# Patient Record
Sex: Female | Born: 1970
Health system: Southern US, Community
[De-identification: ages and names within clinical notes are randomized; demographics above are authoritative.]

## PROBLEM LIST (undated history)

## (undated) DIAGNOSIS — F32A Depression, unspecified: Secondary | ICD-10-CM

## (undated) DIAGNOSIS — F329 Major depressive disorder, single episode, unspecified: Secondary | ICD-10-CM

## (undated) DIAGNOSIS — J45909 Unspecified asthma, uncomplicated: Secondary | ICD-10-CM

## (undated) DIAGNOSIS — G43909 Migraine, unspecified, not intractable, without status migrainosus: Secondary | ICD-10-CM

## (undated) DIAGNOSIS — G43019 Migraine without aura, intractable, without status migrainosus: Principal | ICD-10-CM

## (undated) DIAGNOSIS — F419 Anxiety disorder, unspecified: Secondary | ICD-10-CM

## (undated) HISTORY — DX: Anxiety disorder, unspecified: F41.9

## (undated) HISTORY — DX: Unspecified asthma, uncomplicated: J45.909

## (undated) HISTORY — PX: BREAST SURGERY: SHX581

## (undated) HISTORY — DX: Migraine without aura, intractable, without status migrainosus: G43.019

## (undated) HISTORY — PX: ARTHROSCOPIC REPAIR ACL: SUR80

## (undated) HISTORY — DX: Migraine, unspecified, not intractable, without status migrainosus: G43.909

## (undated) HISTORY — PX: KIDNEY SURGERY: SHX687

## (undated) HISTORY — DX: Major depressive disorder, single episode, unspecified: F32.9

## (undated) HISTORY — DX: Depression, unspecified: F32.A

---

## 1991-08-04 HISTORY — PX: GALLBLADDER SURGERY: SHX652

## 2005-01-09 ENCOUNTER — Observation Stay (HOSPITAL_COMMUNITY): Admission: AD | Admit: 2005-01-09 | Discharge: 2005-01-10 | Payer: Self-pay | Admitting: Urology

## 2005-01-09 ENCOUNTER — Emergency Department (HOSPITAL_COMMUNITY): Admission: EM | Admit: 2005-01-09 | Discharge: 2005-01-09 | Payer: Self-pay | Admitting: Emergency Medicine

## 2005-01-20 ENCOUNTER — Encounter: Payer: Self-pay | Admitting: Urology

## 2005-01-20 ENCOUNTER — Inpatient Hospital Stay (HOSPITAL_COMMUNITY): Admission: RE | Admit: 2005-01-20 | Discharge: 2005-01-23 | Payer: Self-pay | Admitting: Urology

## 2005-02-22 ENCOUNTER — Emergency Department (HOSPITAL_COMMUNITY): Admission: EM | Admit: 2005-02-22 | Discharge: 2005-02-23 | Payer: Self-pay | Admitting: Emergency Medicine

## 2005-04-06 ENCOUNTER — Emergency Department (HOSPITAL_COMMUNITY): Admission: EM | Admit: 2005-04-06 | Discharge: 2005-04-07 | Payer: Self-pay | Admitting: Emergency Medicine

## 2006-12-03 ENCOUNTER — Emergency Department (HOSPITAL_COMMUNITY): Admission: EM | Admit: 2006-12-03 | Discharge: 2006-12-03 | Payer: Self-pay | Admitting: Emergency Medicine

## 2007-04-19 IMAGING — US IR DIL URETER W/O STENT*R*
1 series · 2 of 2 positions shown · non-contrast
Comparison: None.

CLINICAL DATA: Partial staghorn calculus on the right with chronic urinary
tract infection. Access requested prior to nephrostolithotomy tomorrow.

PERCUTANEOUS INTRODUCTION OF A RIGHT URETERAL CATHETER 01/20/2005:

[Series 1: sp us guidance · 2 of 2 slices shown]
[im 1/2]
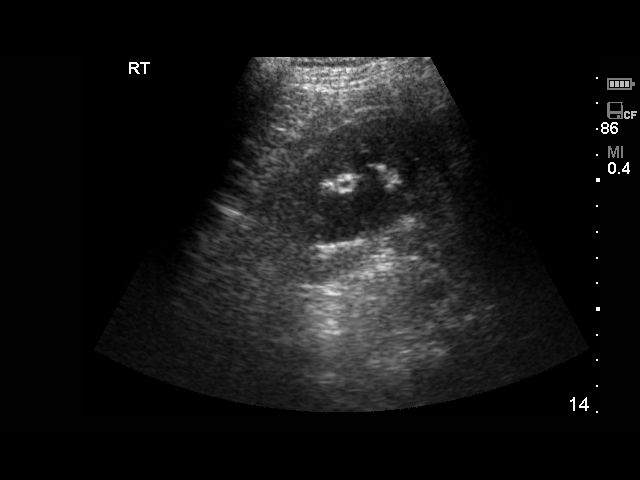
[im 2/2]
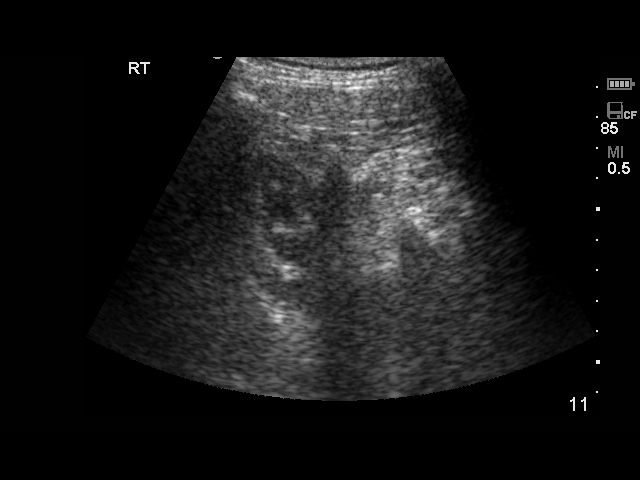

[2 of 2 positions shown; findings below may reference images not displayed]

Medications administered: 400 mg intravenous Ciprofloxacin prior to the
procedure. 5 mg total intravenous     Versed and 300 mcg total intravenous
fentanyl during the procedure.

Total moderate sedation time: 80 minutes.

Total fluoroscopy time: 16.3 minutes.

Total contrast utilized: 5 cc Pmnipaque-AII.

Complications: None.

Procedure:  Informed consent was obtained from the patient prior to procedure.
With the patient prone, using usual sterile technique and 1% lidocaine as local
anesthetic, I initially used a 21-gauge Chiba needle in an attempt to access the
lower pole calyx containing the partial staghorn calculus. I was able to
puncture the stone on 3 separate occasions, and on 2 of these occasions, was
able to advance an 0.018 mandril guidewire into the renal collecting system.
However, I was unable to advance the transitional dilator from the Peider Hangartner into
the collecting system. At one point, I was able to advance a 5 French
dilator into the upper pole, but was unable to direct the guidewire into the
ureter. After these 3 attempts and approximately 45 minutes or so of procedure
time, I elected to abort these attempts.

Therefore, using direct ultrasound visualization, I was able to advance a
21-gauge Chiba needle into the dilated upper pole collecting system. The
mandril wire advanced into the ureter without difficulty. I was then able, with
some difficulty, to advance the transitional dilator from the Peider Hangartner into the
ureter. The mandril wire was then exchanged for a 0.035 Benson wire which was
advanced into the bladder. A 5 French Kumpe catheter was then advanced over the
wire such that its tip is in the bladder. This was confirmed with an injection
of 5 cc of contrast. The catheter was capped and was secured to the patient skin
using a 0 silk suture.

The patient tolerated the procedure well and there were no apparent immediate
complications. She is scheduled for intraoperative nephrostolithotomy tomorrow
with Dr. Trx.
IMPRESSION: Access into the right kidney was obtained via the dilated upper pole collecting
system after 3 unsuccessful attempts at gaining access around the lower pole
calculus. A 5 French Kumpe catheter was placed through the renal collecting
system and ureter such that its tip is in the bladder.

## 2007-05-01 ENCOUNTER — Emergency Department (HOSPITAL_COMMUNITY): Admission: EM | Admit: 2007-05-01 | Discharge: 2007-05-01 | Payer: Self-pay | Admitting: Emergency Medicine

## 2009-02-26 ENCOUNTER — Ambulatory Visit: Payer: Self-pay | Admitting: Urology

## 2009-03-06 ENCOUNTER — Ambulatory Visit: Payer: Self-pay | Admitting: Specialist

## 2009-08-04 ENCOUNTER — Inpatient Hospital Stay: Payer: Self-pay | Admitting: Psychiatry

## 2010-12-19 NOTE — H&P (Signed)
NAME:  Kara Weaver, Kara Weaver NO.:  [id number]   MEDICAL RECORD NO.:  [id number]          PATIENT TYPE:  EMS   LOCATION:  MAJO                         FACILITY:  [facility]   PHYSICIAN:  Lindaann Slough, M.D.  DATE OF BIRTH:  1971-02-12   DATE OF ADMISSION:  01/09/2005  DATE OF DISCHARGE:                                HISTORY & PHYSICAL   CHIEF COMPLAINT:  Right flank pain, fatigue, and hypotension.   HISTORY OF PRESENT ILLNESS:  The patient is a 40 year old female who had  been complaining of right flank pain for the past 2 weeks.  She also had  been having frequency and dysuria.  She went to [facility] 5 days ago  and was told that she had a urinary tract infection and she was sent home  with antibiotics.  She continued to have flank pain and has been sleeping a  lot and had passed out at home.  She was then brought to the emergency room  by her family last night and a CT scan of the abdomen and pelvis showed  right hydronephrosis and a right staghorn calculus.  The patient's blood  pressure on admission was 111/69 and it went down to 87/53.  Urinalysis  shows too numerous to count WBC's, 21 to 50 RBC's, and many bacteria, and is  nitrite positive.  The patient is now admitted for further treatment.   PAST MEDICAL HISTORY:  She had stent placement about 12 years ago on the  left side for ureteral obstruction, but she does not know the exact nature  of the obstruction.  She has a history of asthma.   FAMILY HISTORY:  Her father has a history of kidney disease.  Her mother is  doing fine and she has one sister.   SOCIAL HISTORY:  She is single, has one child 64 years old.  Has smoked 1-  1/2 packs a day for 16 years, does not drink.   MEDICATIONS:  Adderall.   ALLERGIES:  SULFA.   PAST SURGICAL HISTORY:  She had laparoscopic cholecystectomy in 1997.   REVIEW OF SYSTEMS:  No cough, no chest pain, no palpitations.  She complains  of headaches, frequency, and  dysuria.  Everything else is negative.   PHYSICAL EXAMINATION:  GENERAL:  This is a well-developed, 40 year old  female who is complaining of right flank pain.  VITAL SIGNS:  Blood pressure is 91/55, pulse 75, respirations 18,  temperature 97.9.  HEENT:  Head is normal.  Pupils equal, round, and reactive to light and  accommodation.  She has nonicteric sclerae.  She has pink conjunctivae.  Ears, nose, and throat are within normal limits.  NECK:  Supple.  No cervical lymph nodes.  No thyromegaly.  CHEST:  Symmetrical.  Lungs fully expanded and clear to auscultation and  percussion.  HEART:  Regular rate and rhythm, no murmurs and no gallops.  ABDOMEN:  Soft, nondistended.  It is tender in the right flank and right  CVA.  Kidneys are not palpable.  Bladder is not distended.  She has an  umbilical ring.  She has no umbilical or  inguinal hernia.  No inguinal  adenopathy.  Her meatus is normal.  She has no cystocele, no caruncle.  PELVIC:  There is no tenderness in the bladder area.  The cervix is firm in  the midline, nontender.  RECTAL:  She has no external hemorrhoids.  Sphincter tone is normal.  No  rectal mass.  SKIN:  Warm and dry.   I reviewed the CT scan with Dr. Jena Gauss and she has a right staghorn  calculus and right hydronephrosis.  No evidence of a ureteral calculus,  however, the proximal right ureter is moderately dilated.   LABORATORY DATA:  Creatinine is 0.9 and BUN is 12, glucose 83, sodium 139,  potassium 3.5, hemoglobin 12.4, hematocrit 36.2, and WBC 8.3.   ADMISSION DIAGNOSES:  1.  Hypotension.  2.  Right hydronephrosis.  3.  Right staghorn calculus.   PLAN:  The patient needs cystoscopy, retrograde pyelogram, and insertion of  JJ catheter and treatment with IV antibiotics and rehydration.  The  procedure, the risks, and the benefits have been discussed with the patient.  She understands and is agreeable.  We will proceed today.       MN/MEDQ  D:  01/09/2005   T:  01/09/2005  Job:  [id number]

## 2010-12-19 NOTE — Op Note (Signed)
Kara Weaver, Kara Weaver NO.:  [id number]   MEDICAL RECORD NO.:  [id number]          PATIENT TYPE:  OUT   LOCATION:  OMED                         FACILITY:  [facility]   PHYSICIAN:  Lindaann Slough, M.D.  DATE OF BIRTH:  November 15, 1970   DATE OF PROCEDURE:  01/21/2005  DATE OF DISCHARGE:                                 OPERATIVE REPORT   PREOPERATIVE DIAGNOSIS:  Right staghorn calculus.   POSTOPERATIVE DIAGNOSIS:  Right staghorn calculus.   PROCEDURE:  Right percutaneous nephrolithotomy   SURGEONS:  Lindaann Slough, M.D.  Jodi Marble. Fredia Sorrow, M.D.   ANESTHESIA:  General.   INDICATION:  The patient is 40 year old female who was seen in the emergency  room on January 09, 2005 with a 2-week history of right flank pain, frequency  and dysuria.  She was treated 5 days prior her ER visit at [facility]  for urinary tract infection.  A CT scan done in the emergency room showed a  right staghorn calculus and right hydronephrosis. A double-J catheter was  inserted and she was then treated for pyelonephritis.  She returned to the  hospital on January 20, 2005 for right percutaneous  nephrostomy and she is now  scheduled for right PCNL.   Under general anesthesia, the patient was prepped and draped and placed in  the prone position.  The nephrostomy catheter catheter was in the upper pole  calix of the kidney and then the stone is in the middle and lower pole and  at that point, it was decided by Dr. Fredia Sorrow to have a better access to the  calculus by doing a lower pole nephrostomy.  Dr. Fredia Sorrow performed that  procedure and then dilated the tract.  Then the nephroscope was then passed  through the Amplatz sheath and a stone was seen in the renal pelvis.  The  stone was fragmented with the lithoclast and the stone fragments were  removed.  Then the stone fragments in the lower pole of the kidney were  extracted.  Then a nephrostogram was done and showed no  evidence of  remaining stone fragments in the kidney.  The renal pelvis, mid-pole kidney  and lower pole calix were identified and there was no evidence of remaining  fragments in the kidney.  A guidewire was then passed through the Amplatz  sheath and a #28-French Councill catheter was passed over the guidewire into  the renal pelvis.  contrast was then injected through the catheter and there  was no evidence of extravasation of contrast and the tip of the catheter is  in the renal pelvis.  The Amplatz sheath was then removed.  The guidewire  was removed.  The nephrostomy catheter of the upper pole kidney was  also removed.  A ureteral catheter was passed over the safety guidewire into  the distal ureter and then the safety wire was removed.  Estimated blood  loss -- 100 mL.  Blood replacement -- none.   The patient tolerated the procedure well and left the OR in satisfactory  condition to post-anesthesia care unit.       MN/MEDQ  D:  01/21/2005  T:  01/22/2005  Job:  [id number]

## 2010-12-19 NOTE — H&P (Signed)
NAMEDANITZA, SCHOENFELDT NO.:  [id number]   MEDICAL RECORD NO.:  [id number]          PATIENT TYPE:  OUT   LOCATION:  OMED                         [facility]ACILITY:  [facility]   PHYSICIAN:  Lindaann Slough, M.D.  DATE OF BIRTH:  August 13, 1970   DATE OF ADMISSION:  01/20/2005  DATE OF DISCHARGE:                                HISTORY & PHYSICAL   CHIEF COMPLAINT:  Right staghorn calculus.   HISTORY OF PRESENT ILLNESS:  The patient is a 40 year old who was seen in  the emergency room on January 09, 2005 with a two week history of right flank  pain, frequency and dysuria. A CT scan of the abdomen and pelvis showed  right staghorn calculus. She had a double-J catheter inserted. She is now  scheduled for right percutaneous nephrolithotomy. She had a right  percutaneous nephrostomy done this morning. She is complaining of severe  right flank pain and she is now admitted for PCNL in the morning.   PAST MEDICAL HISTORY:  Positive for asthma.   PAST SURGICAL HISTORY:  She had laparoscopic cholecystectomy in 1997 and had  a stent placement on the left side for ureteral obstruction 12 years ago.   FAMILY HISTORY:  Her father has a history of kidney disease and she has one  sister, her mother is doing fine.   SOCIAL HISTORY:  She is single, has one child, has smoked 1 1/2 pack a day  for 16 years, does not drink.   MEDICATIONS:  Adderall.   ALLERGIES:  She is allergic to SULFA.   REVIEW OF SYMPTOMS:  She complains of right flank pain, frequency and  dysuria. Everything else is negative.   PHYSICAL EXAMINATION:  GENERAL:  This is a well-developed 40 year old female  who is complaining of right flank pain.  VITAL SIGNS:  Blood pressure is 111/66, pulse 88, respirations 21,  temperature 97.  HEENT:  Her head is normal, pupils are equal and reactive to light and  accommodation. She has nonicteric sclerae and pink conjunctivae. Ears, nose  and throat are within normal limits.  NECK:   Supple, no cervical adenopathy, no thyromegaly.  LUNGS:  Clear to auscultation and percussion.  HEART:  Regular rhythm.  ABDOMEN:  Soft. She is tender in the right flank and she has right CVA  tenderness. Her kidneys are not palpable, her bladder is not distended. She  has no umbilical or inguinal hernia. Meatus is normal. She has no co  cystocele, no caruncle.  PELVIC/RECTAL:  Not done at this time. She had pelvic and rectal examination  done on January 09, 2005.   IMPRESSION:  Right staghorn calculus.   PLAN:  Percutaneous nephrolithotomy in the morning.       MN/MEDQ  D:  01/20/2005  T:  01/20/2005  Job:  [id number]

## 2010-12-19 NOTE — Discharge Summary (Signed)
Kara Weaver, Kara Weaver NO.:  [id number]   MEDICAL RECORD NO.:  [id number]          PATIENT TYPE:  INP   LOCATION:  1419                         FACILITY:  [facility]   PHYSICIAN:  Lindaann Slough, M.D.  DATE OF BIRTH:  1970-09-30   DATE OF ADMISSION:  01/20/2005  DATE OF DISCHARGE:  01/23/2005                                 DISCHARGE SUMMARY   DISCHARGE DIAGNOSES:  1.  Right staghorn calculus.  2.  Pyelonephritis.  3.  Hydronephrosis.   PROCEDURE:  1.  Right percutaneous nephrostomy on January 20, 2005.  2.  Right percutaneous nephrolithotomy on January 21, 2005.  3.  Right nephrostogram and insertion of double-J catheter, January 23, 2005.   The patient is a 40 year old female who was seen in the emergency room on  January 09, 2005 with a two-week history of right flank pain, frequency, and  dysuria.  CT scan of the abdomen and pelvis showed right staghorn calculus  with right hydronephrosis and perinephric inflammation.  A right double-J  catheter was then inserted, and she was discharged home.  She was readmitted  on January 20, 2005 for right PCNL.  She had a right percutaneous nephrostomy  done on January 20, 2005 and then percutaneous nephrolithotomy on January 21, 2005.   PHYSICAL EXAMINATION:  VITAL SIGNS:  Blood pressure 111/66, pulse 88,  respirations 21, temperature 97.  HEENT:  Head normal.  Pupils are equal and reactive to light and  accommodation.  Ears, nose, and throat  within normal limits.  NECK:  Supple.  No cervical adenopathy.  No thyromegaly.  LUNGS:  Lungs are clear to auscultation and percussion.  HEART:  Regular rhythm.  ABDOMEN:  Soft, nondistended.  Tender in the right flank with right CVA  tenderness.  Bowel sounds were normal.   Hemoglobin 12.4, hematocrit 36.2, WBC 8.3.  Sodium 139, potassium 3.5, BUN  12, creatinine 0.9.   The patient had right percutaneous nephrolithotomy on January 21, 2005.  Hemoglobin postop was 10.3.  Hematocrit 29.9.  WBC  9.3.   She was treated with Ancef and gentamicin.  The nephrostomy and Foley  catheter were draining clear urine.  She remained afebrile.  On January 23, 2005, a nephrostogram showed no evidence of remaining stone fragments.  The  nephrostomy catheter was removed and replaced with an antegrade double-J  catheter.  The Foley catheter was removed.  She was then discharged home on  Cipro 500 mg twice daily, Dilaudid 4 mg every 4-6 hours as needed for pain,  Phenergan 4 mg every 4-6 hours as needed for pain, albuterol inhaler 2 puffs  4 times per day as needed, Advair inhaler 1 puff twice daily.   CONDITION ON DISCHARGE:  Improved.   DISCHARGE DIET:  Regular.   Patient was instructed to not to do any lifting, straining, or driving until  further advised.  She will be seen in the office in two weeks.  If she does  not have anymore drainage from the flank, the double-J catheter  will then  be removed.       MN/MEDQ  D:  01/23/2005  T:  01/24/2005  Job:  [id number]

## 2010-12-19 NOTE — Op Note (Signed)
NAMELANISHA, Kara Weaver NO.:  [id number]   MEDICAL RECORD NO.:  [id number]          PATIENT TYPE:  INP   LOCATION:  0001                         FACILITY:  [facility]   PHYSICIAN:  Lindaann Slough, M.D.  DATE OF BIRTH:  03/07/1971   DATE OF PROCEDURE:  01/09/2005  DATE OF DISCHARGE:                                 OPERATIVE REPORT   PREOPERATIVE DIAGNOSES:  Right staghorn calculus, right hydronephrosis,  urinary tract infection.   POSTOPERATIVE DIAGNOSES:  Right staghorn calculus, right hydronephrosis,  urinary tract infection.   PROCEDURE:  Cystoscopy, right retrograde pyelogram and insertion of double-J  catheter.   SURGEON:  Lindaann Slough, M.D.   ANESTHESIA:  General.   INDICATIONS FOR PROCEDURE:  The patient is a 40 year old female who had been  complaining of right flank pain for the past two weeks. The pain has been  associated with fatigue and nausea. She was seen at [facility] a week  ago and was sent home with antibiotics for urinary tract infection. She did  not improve and went to the emergency room last night and a CT scan of the  abdomen and pelvis showed right staghorn calculus and hydronephrosis of the  upper pole of the right kidney. She also was hypotensive. She is now  admitted for further treatment and scheduled today for cystoscopy and  insertion of double-J catheter.   Under general anesthesia, the patient was prepped and draped and placed in  the dorsal lithotomy position. A #22 Wappler cystoscope was inserted in the  bladder. Cloudy urine was drained out of the bladder. There is no stone or  tumor in the bladder. The ureteral orifices are in normal position and  shape.   A cone tip catheter was then passed through the cystoscope into the right  ureteral orifice, contrast was then injected through the cone tip catheter.  The ureter appeared normal. There is a large filling defect in the renal  pelvis and in the lower pole and mid  pole calices of the right kidney. The  cone tip catheter was then removed.   Cloudy urine drained out of the right kidney. A guidewire was then passed  through the cystoscope into the right ureter and then a #6 French 26 double-  J catheter was passed over the guidewire. The proximal curl of the double-J  catheter is in the dilated upper pole calix. The distal curl is in the  bladder. The bladder was then emptied and the cystoscope and guidewire were  removed.   The patient tolerated the procedure well and left the OR in satisfactory  condition to post anesthesia care unit.      MN/MEDQ  D:  01/09/2005  T:  01/09/2005  Job:  [id number]

## 2011-05-14 LAB — CBC
HCT: 37.7
MCHC: 34.2
MCV: 88.4
Platelets: 277
RDW: 13.1

## 2011-05-14 LAB — URINALYSIS, ROUTINE W REFLEX MICROSCOPIC
Glucose, UA: NEGATIVE
Ketones, ur: NEGATIVE
Nitrite: NEGATIVE
Protein, ur: NEGATIVE

## 2011-05-14 LAB — DIFFERENTIAL
Basophils Absolute: 0.1
Eosinophils Absolute: 0.1
Eosinophils Relative: 1

## 2011-05-14 LAB — URINE CULTURE: Colony Count: >=100000

## 2011-05-14 LAB — BASIC METABOLIC PANEL
CO2: 24
Creatinine, Ser: 0.69

## 2011-05-14 LAB — URINE MICROSCOPIC-ADD ON

## 2011-05-14 LAB — POCT PREGNANCY, URINE: Operator id: 272551

## 2011-05-26 IMAGING — CT CT ABDOMEN WO/W CM
2 of 4 series · 14 of 32 positions shown, 19 images · non-contrast
Comparison: none

REASON FOR EXAM: UTI pyelonephritis
COMMENTS:

[Series 3: with · axial · 0.70mm/px · z∈[-882,-670]mm · 8 of 93 slices shown, 13 images]
[im 11/93  soft-tissue]
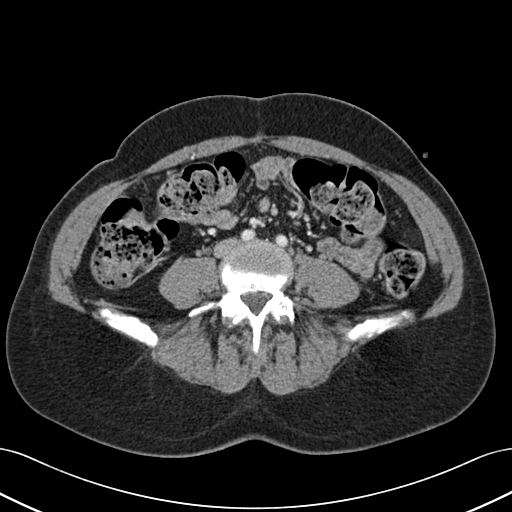
[im 11/93  bone]
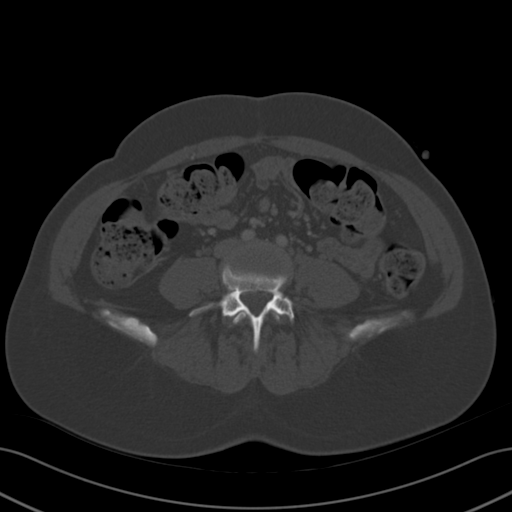
[im 21/93  soft-tissue]
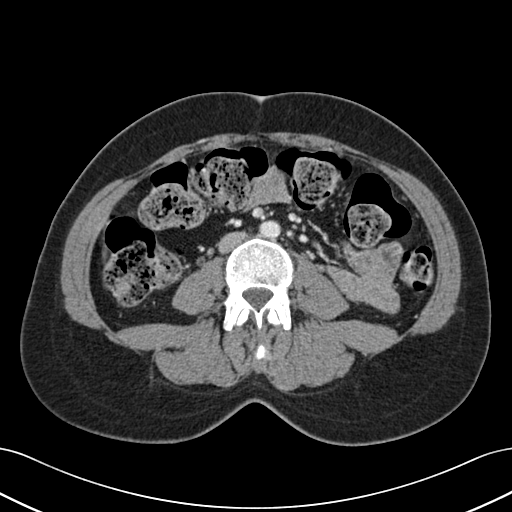
[im 31/93  soft-tissue]
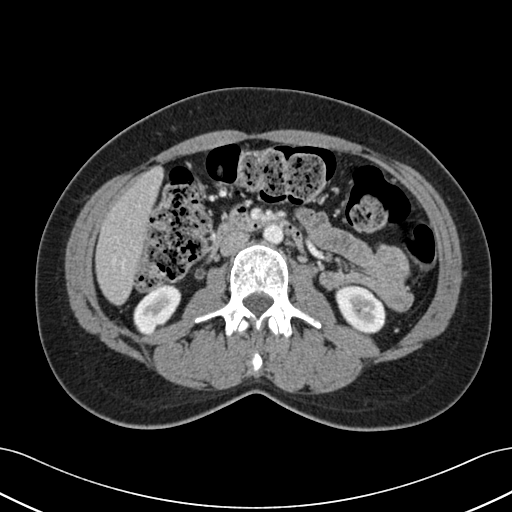
[im 41/93  soft-tissue]
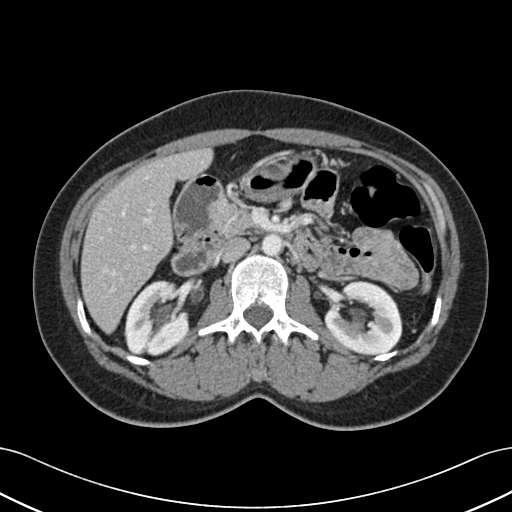
[im 52/93  soft-tissue]
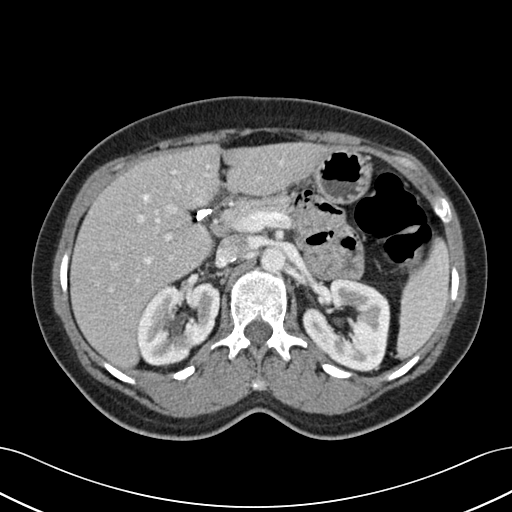
[im 52/93  lung]
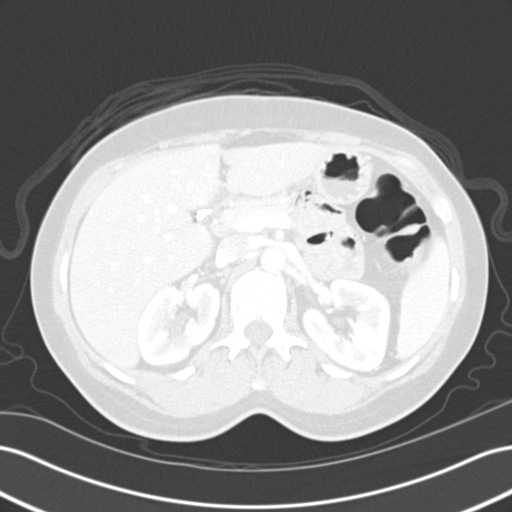
[im 62/93  soft-tissue]
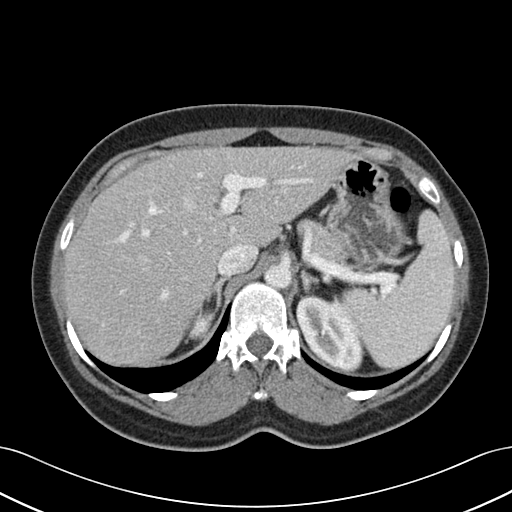
[im 62/93  lung]
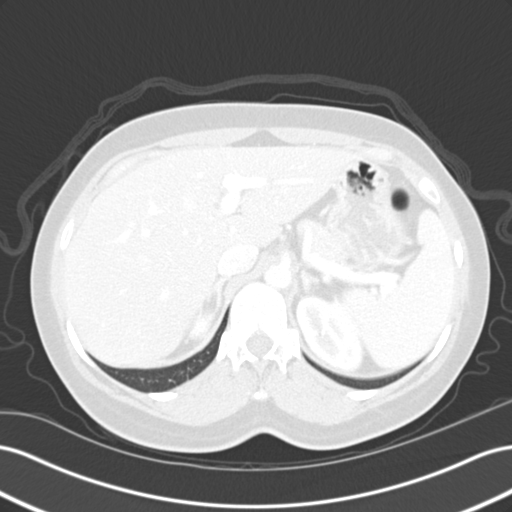
[im 72/93  soft-tissue]
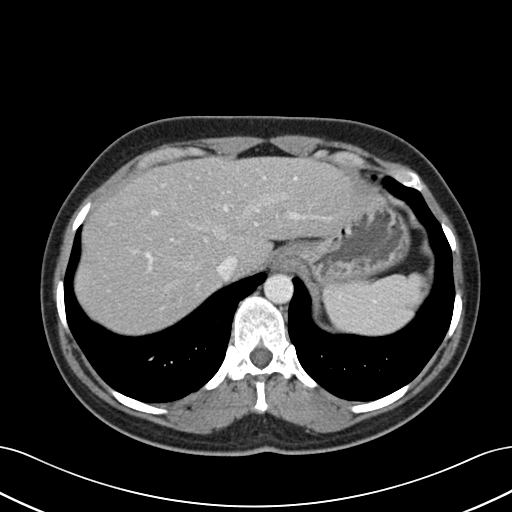
[im 72/93  lung]
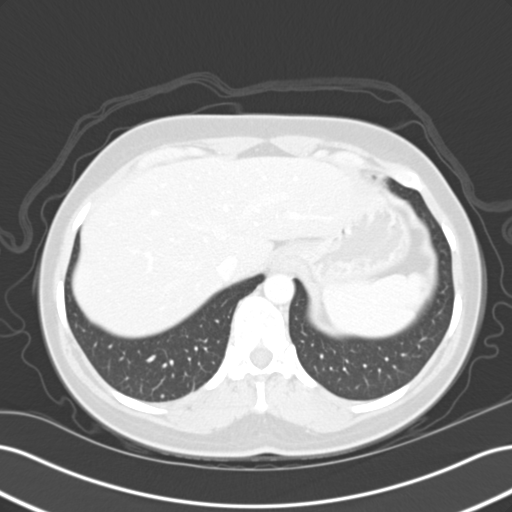
[im 82/93  soft-tissue]
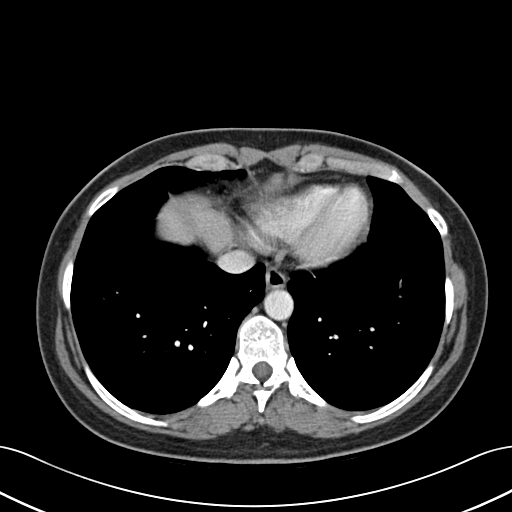
[im 82/93  lung]
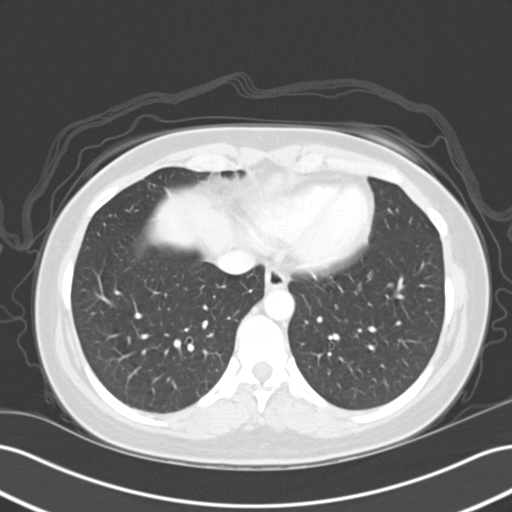

[Series 4: delay · axial · delayed · 0.70mm/px · z∈[-882,-730]mm · 6 of 93 slices shown]
[im 11/93  soft-tissue]
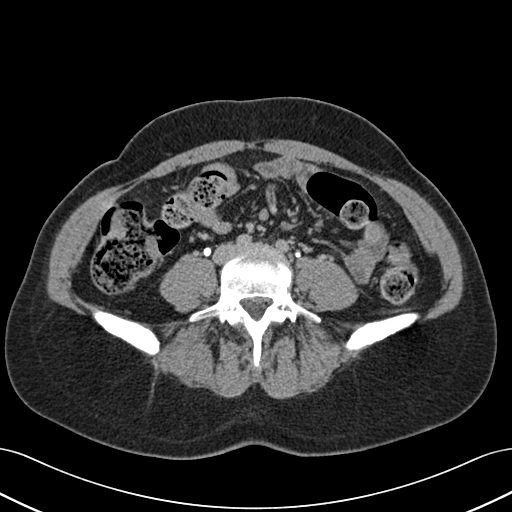
[im 21/93  soft-tissue]
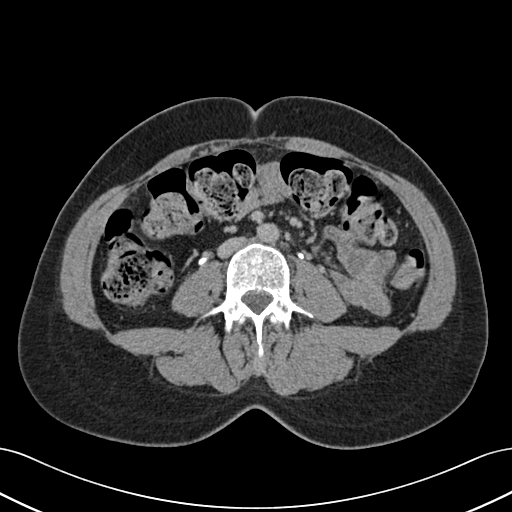
[im 31/93  soft-tissue]
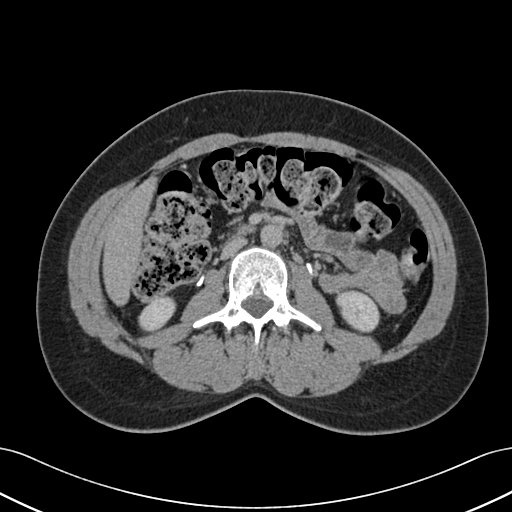
[im 41/93  soft-tissue]
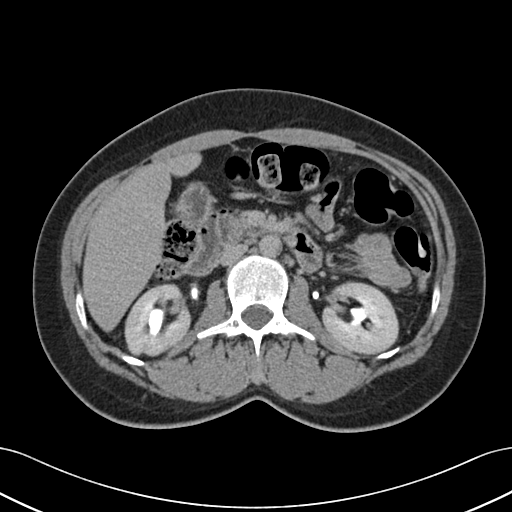
[im 52/93  soft-tissue]
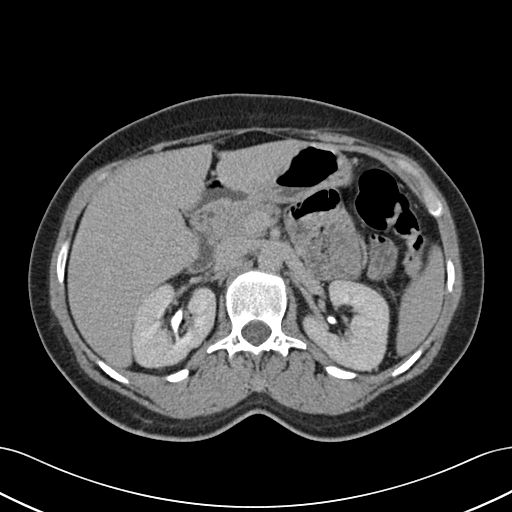
[im 62/93  soft-tissue]
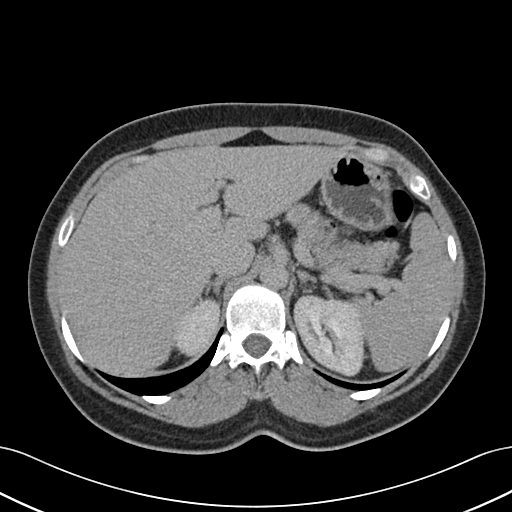

[14 of 32 positions shown; findings below may reference images not displayed]

PROCEDURE:     CT  - CT ABDOMEN STANDARD W/WO  - February 26, 2009  [DATE]

RESULT:     Triphasic CT of the abdomen is performed in the standard
fashion. Images are reconstructed at 3 mm slice thickness. The patient has
no previous examination for comparison.

There is left nephrolithiasis with a stone demonstrated in the midpole of
the left kidney on image #49 measuring approximately 2.5 to 3 mm. There is
no evidence of hydronephrosis or hydroureter. No definite additional calculi
are evident. There is no evidence of abnormal bowel distention.
Cholecystectomy clips are present. There is linear calcification along the
posterior aspect of the inferior vena cava. This could be volume averaging
with some atherosclerotic calcification in a renal artery but that felt to
be unlikely given the lack of atherosclerotic calcification elsewhere. There
does not appear to be a definite filling defect to suggest a soft tissue
mass or thrombus in the inferior vena cava. There does appear to be some
fatty infiltration of the liver. The kidneys demonstrate no discrete mass.
Both kidneys excrete contrast opacified urine on delayed images. There is no
evidence of adenopathy. Pancreas, spleen and adrenal glands appear
unremarkable.
IMPRESSION: 1. Nonobstructing left renal calculus.
2. Calcification along the posterior aspect of the inferior vena cava just
superior to the level of the left renal vein.
3. Cholecystectomy clips.
4. Fatty infiltration of the liver.

## 2016-09-27 DIAGNOSIS — N3000 Acute cystitis without hematuria: Secondary | ICD-10-CM | POA: Diagnosis not present

## 2016-09-27 DIAGNOSIS — N23 Unspecified renal colic: Secondary | ICD-10-CM | POA: Diagnosis not present

## 2016-12-05 DIAGNOSIS — N390 Urinary tract infection, site not specified: Secondary | ICD-10-CM | POA: Diagnosis not present

## 2016-12-05 DIAGNOSIS — E876 Hypokalemia: Secondary | ICD-10-CM | POA: Diagnosis not present

## 2016-12-05 DIAGNOSIS — N23 Unspecified renal colic: Secondary | ICD-10-CM | POA: Diagnosis not present

## 2016-12-05 DIAGNOSIS — R109 Unspecified abdominal pain: Secondary | ICD-10-CM | POA: Diagnosis not present

## 2016-12-30 DIAGNOSIS — N3001 Acute cystitis with hematuria: Secondary | ICD-10-CM | POA: Diagnosis not present

## 2016-12-31 DIAGNOSIS — Z79899 Other long term (current) drug therapy: Secondary | ICD-10-CM | POA: Diagnosis not present

## 2016-12-31 DIAGNOSIS — F319 Bipolar disorder, unspecified: Secondary | ICD-10-CM | POA: Diagnosis not present

## 2017-01-15 DIAGNOSIS — Z681 Body mass index (BMI) 19 or less, adult: Secondary | ICD-10-CM | POA: Diagnosis not present

## 2017-01-15 DIAGNOSIS — G43109 Migraine with aura, not intractable, without status migrainosus: Secondary | ICD-10-CM | POA: Diagnosis not present

## 2017-02-19 DIAGNOSIS — F319 Bipolar disorder, unspecified: Secondary | ICD-10-CM | POA: Diagnosis not present

## 2017-03-08 ENCOUNTER — Ambulatory Visit (INDEPENDENT_AMBULATORY_CARE_PROVIDER_SITE_OTHER): Payer: Medicare Other | Admitting: Neurology

## 2017-03-08 ENCOUNTER — Encounter: Payer: Self-pay | Admitting: Neurology

## 2017-03-08 DIAGNOSIS — G43019 Migraine without aura, intractable, without status migrainosus: Secondary | ICD-10-CM | POA: Insufficient documentation

## 2017-03-08 HISTORY — DX: Migraine without aura, intractable, without status migrainosus: G43.019

## 2017-03-08 MED ORDER — DIVALPROEX SODIUM 500 MG PO DR TAB: 500.0000 mg | DELAYED_RELEASE_TABLET | Freq: Two times a day (BID) | ORAL | 3 refills | Status: DC

## 2017-03-08 MED ORDER — RIZATRIPTAN BENZOATE 10 MG PO TABS: 10.0000 mg | ORAL_TABLET | Freq: Three times a day (TID) | ORAL | 3 refills | Status: DC | PRN

## 2017-03-08 NOTE — Progress Notes (Signed)
Reason for visit: Migraine headache  Referring physician: Dr. Leonia Reader  Kara Weaver is a 46 y.o. female  History of present illness:  Kara Weaver is a 46 year old left-handed white female with a history of chronic migraine headache. The patient has had headaches since her teenage years and throughout her entire adult life. The headaches have gradually become more frequent over time, she is now having over 20 days a month with headache. When she gets a headache sometimes the headache will last a full month before it goes away. The patient indicates that the headaches usually begin around the frontal areas and may go back to the occipital regions. The patient may have some nausea and vomiting, she may have white spots in front of the eyes and she has significant photophobia and phonophobia. Two or 3 days a month she is completely incapacitated, she has to get into a dark room and put a pillow over her head to block the noise. The patient has carpal tunnel syndrome and some tingling in the hands, otherwise she has no numbness. She denies any weakness. She may have some occasional dizziness with the headache and some neck stiffness. She indicates that stress and certain odors may bring on her headache. She is on Topamax for her bipolar disorder and she believes that this has been helpful. She recently was placed on gabapentin but this makes her too spacey and she has difficulty with cognitive processing on the drug. She takes Fioricet at times for the headache, but this is not a daily medication for her. Ibuprofen and Tylenol do not help the headache. She indicates that her mother and her daughter also have headache. She is sent to this office for an evaluation. She has undergone a CT scan of the brain in the past, this has not been done recently. She is very claustrophobic and she is not sure whether she can tolerate a MRI scan or not.  Past Medical History:  Diagnosis Date  . Anxiety   . Asthma     . Depression   . Migraine     Past Surgical History:  Procedure Laterality Date  . ARTHROSCOPIC REPAIR ACL    . BREAST SURGERY Left    Benign mass removed  . GALLBLADDER SURGERY  1993   Removed  . KIDNEY SURGERY     Stent placed    History reviewed. No pertinent family history.  Social history:  reports that she has been smoking.  She has been smoking about 0.50 packs per day. She has never used smokeless tobacco. She reports that she does not drink alcohol or use drugs.  Medications:  Prior to Admission medications   Medication Sig Start Date End Date Taking? Authorizing Provider  ARIPiprazole (ABILIFY) 10 MG tablet Take 10 mg by mouth daily. 10 in the am, and 20mg  in the pm   Yes [provider]  benztropine (COGENTIN) 2 MG tablet Take 2 mg by mouth daily.   Yes [provider]  carbamazepine (TEGRETOL) 200 MG tablet Take 200 mg by mouth 3 (three) times daily. 2 in the morning and 3 in the pm   Yes [provider]  gabapentin (NEURONTIN) 300 MG capsule Take 300 mg by mouth 2 (two) times daily.  02/12/17  Yes [provider]  ondansetron (ZOFRAN) 8 MG tablet TK 1 T PO Q 6 H PRN 01/10/17  Yes [provider]  topiramate (TOPAMAX) 50 MG tablet Take 150 mg by mouth 2 (two)  times daily.   Yes [provider]  traZODone (DESYREL) 100 MG tablet Take 100 mg by mouth at bedtime as needed for sleep.   Yes [provider]  Butalbital-APAP-Caffeine 50-300-40 MG CAPS TK 1 TO 2 CS PO Q 4 H AS NEEDED. NTE 6 CS PER 24 H 01/15/17   [provider]      Allergies  Allergen Reactions  . Bactrim [Sulfamethoxazole-Trimethoprim]   . Doxycycline   . Levaquin [Levofloxacin]   . Morphine And Related   . Phenergan [Promethazine]     ROS:  Out of a complete 14 system review of symptoms, the patient complains only of the following symptoms, and all other reviewed systems are negative.  Weight loss, fatigue Ringing in the  ears Cough Increased thirst Joint pain, joint swelling Allergies, frequent infections Headache, dizziness Depression, anxiety, decreased energy, racing thoughts Insomnia, restless legs  Blood pressure 102/61, pulse 83, height 5\' 8"  (1.727 m), weight 133 lb 8 oz (60.6 kg).  Physical Exam  General: The patient is alert and cooperative at the time of the examination.  Eyes: Pupils are equal, round, and reactive to light. Discs are flat bilaterally.  Neck: The neck is supple, no carotid bruits are noted.  Respiratory: The respiratory examination is clear.  Cardiovascular: The cardiovascular examination reveals a regular rate and rhythm, no obvious murmurs or rubs are noted.  Neuromuscular: Range of movement of the cervical spine was full, no crepitus was noted in the temporomandibular joints.  Skin: Extremities are without significant edema.  Neurologic Exam  Mental status: The patient is alert and oriented x 3 at the time of the examination. The patient has apparent normal recent and remote memory, with an apparently normal attention span and concentration ability.  Cranial nerves: Facial symmetry is present. There is good sensation of the face to pinprick and soft touch bilaterally. The strength of the facial muscles and the muscles to head turning and shoulder shrug are normal bilaterally. Speech is well enunciated, no aphasia or dysarthria is noted. Extraocular movements are full. Visual fields are full. The tongue is midline, and the patient has symmetric elevation of the soft palate. No obvious hearing deficits are noted.  Motor: The motor testing reveals 5 over 5 strength of all 4 extremities. Good symmetric motor tone is noted throughout.  Sensory: Sensory testing is intact to pinprick, soft touch, vibration sensation, and position sense on all 4 extremities. No evidence of extinction is noted.  Coordination: Cerebellar testing reveals good finger-nose-finger and heel-to-shin  bilaterally.  Gait and station: Gait is normal. Tandem gait is normal. Romberg is negative. No drift is seen.  Reflexes: Deep tendon reflexes are symmetric and normal bilaterally. Toes are downgoing bilaterally.   Assessment/Plan:  1. Common migraine, intractable  2. Bipolar disorder  The patient indicates that she does not believe that she can tolerate higher doses of gabapentin. We will get her off the medication a try Depakote taking 500 mg a day for 2 weeks and then go to 500 mg twice daily. The Depakote may also benefit her bipolar disorder. The patient will follow-up in 3 months. She was given a prescription for Maxalt to take if needed for the headache. Imitrex previously did not help. She will call for any dose adjustments of her medication. In the future she may be a candidate for Botox or Aimovig.  Marlan Palau. Keith Rody Keadle MD 03/08/2017 2:13 PM  [facility] [address] [place], [place] [id number]  Phone [phone]  Fax [phone]

## 2017-03-08 NOTE — Patient Instructions (Addendum)
   We will stop the gabapentin and start Depakote taking 500 mg twice a day for 2 weeks, then take one tablet twice a day.  Depakote (valproic acid) is a seizure medication that also has an FDA approval for migraine headache. The most common potential side effects of this medication include weight gain, tremor, or possible stomach upset. This medication can potentially cause liver problems. If confusion is noted on this medication, contact our office immediately.   You may take Maxalt if needed for the headache.

## 2017-03-11 DIAGNOSIS — R51 Headache: Secondary | ICD-10-CM | POA: Diagnosis not present

## 2017-03-23 ENCOUNTER — Telehealth: Payer: Self-pay | Admitting: Neurology

## 2017-03-23 MED ORDER — GABAPENTIN 300 MG PO CAPS: ORAL_CAPSULE | ORAL | 3 refills | Status: DC

## 2017-03-23 NOTE — Addendum Note (Signed)
Addended by: York Spaniel on: 03/23/2017 05:05 PM   Modules accepted: Orders

## 2017-03-23 NOTE — Telephone Encounter (Signed)
error 

## 2017-03-23 NOTE — Telephone Encounter (Signed)
Patient called office in reference to being prescribed divalproex (DEPAKOTE) 500 MG DR tablet she had to stop the medication due to effecting her ARIPiprazole (ABILIFY) 10 MG tablet, carbamazepine (TEGRETOL) 200 MG tablet it was making her moods very bad.   Please call with any questions.

## 2017-03-23 NOTE — Telephone Encounter (Signed)
I called patient. The Depakote was making her moody, she stopped the medication.  We will try to get back on the gabapentin taking 300 mg in the morning and 600 mg in the evening. The patient may benefit from Aimovig in the future.

## 2017-03-27 DIAGNOSIS — H10022 Other mucopurulent conjunctivitis, left eye: Secondary | ICD-10-CM | POA: Diagnosis not present

## 2017-04-02 ENCOUNTER — Telehealth: Payer: Self-pay | Admitting: *Deleted

## 2017-04-02 NOTE — Telephone Encounter (Signed)
Request was received to complete a PA for Rizatriptan 10mg  tabs #12.  Per [facility], no PA needed.  Pharmacy tried to run rx as a 4 day supply.  They need to run rx. as a 30 day supply.  I have called pharmacy and let them know.  I have spoken with pt. and let her know as well/fim

## 2017-04-09 DIAGNOSIS — R202 Paresthesia of skin: Secondary | ICD-10-CM | POA: Diagnosis not present

## 2017-04-09 DIAGNOSIS — R200 Anesthesia of skin: Secondary | ICD-10-CM | POA: Diagnosis not present

## 2017-04-09 DIAGNOSIS — Z23 Encounter for immunization: Secondary | ICD-10-CM | POA: Diagnosis not present

## 2017-04-13 DIAGNOSIS — N201 Calculus of ureter: Secondary | ICD-10-CM | POA: Diagnosis not present

## 2017-04-13 DIAGNOSIS — N3020 Other chronic cystitis without hematuria: Secondary | ICD-10-CM | POA: Diagnosis not present

## 2017-04-13 DIAGNOSIS — N318 Other neuromuscular dysfunction of bladder: Secondary | ICD-10-CM | POA: Diagnosis not present

## 2017-04-23 DIAGNOSIS — N200 Calculus of kidney: Secondary | ICD-10-CM | POA: Diagnosis not present

## 2017-04-28 DIAGNOSIS — H43813 Vitreous degeneration, bilateral: Secondary | ICD-10-CM | POA: Diagnosis not present

## 2017-04-29 DIAGNOSIS — N3289 Other specified disorders of bladder: Secondary | ICD-10-CM | POA: Diagnosis not present

## 2017-04-30 DIAGNOSIS — N3020 Other chronic cystitis without hematuria: Secondary | ICD-10-CM | POA: Diagnosis not present

## 2017-04-30 DIAGNOSIS — N200 Calculus of kidney: Secondary | ICD-10-CM | POA: Diagnosis not present

## 2017-04-30 DIAGNOSIS — N201 Calculus of ureter: Secondary | ICD-10-CM | POA: Diagnosis not present

## 2017-05-06 ENCOUNTER — Other Ambulatory Visit: Payer: Self-pay | Admitting: Neurology

## 2017-05-07 DIAGNOSIS — N3020 Other chronic cystitis without hematuria: Secondary | ICD-10-CM | POA: Diagnosis not present

## 2017-05-07 DIAGNOSIS — N3090 Cystitis, unspecified without hematuria: Secondary | ICD-10-CM | POA: Diagnosis not present

## 2017-05-07 DIAGNOSIS — N200 Calculus of kidney: Secondary | ICD-10-CM | POA: Diagnosis not present

## 2017-05-10 DIAGNOSIS — Z0000 Encounter for general adult medical examination without abnormal findings: Secondary | ICD-10-CM | POA: Diagnosis not present

## 2017-05-10 DIAGNOSIS — J45909 Unspecified asthma, uncomplicated: Secondary | ICD-10-CM | POA: Diagnosis not present

## 2017-05-10 DIAGNOSIS — Z1389 Encounter for screening for other disorder: Secondary | ICD-10-CM | POA: Diagnosis not present

## 2017-05-10 DIAGNOSIS — G8929 Other chronic pain: Secondary | ICD-10-CM | POA: Diagnosis not present

## 2017-05-21 DIAGNOSIS — N3020 Other chronic cystitis without hematuria: Secondary | ICD-10-CM | POA: Diagnosis not present

## 2017-05-21 DIAGNOSIS — N200 Calculus of kidney: Secondary | ICD-10-CM | POA: Diagnosis not present

## 2017-05-26 DIAGNOSIS — M25561 Pain in right knee: Secondary | ICD-10-CM | POA: Diagnosis not present

## 2017-05-26 DIAGNOSIS — M6281 Muscle weakness (generalized): Secondary | ICD-10-CM | POA: Diagnosis not present

## 2017-05-26 DIAGNOSIS — M25562 Pain in left knee: Secondary | ICD-10-CM | POA: Diagnosis not present

## 2017-05-26 DIAGNOSIS — R2681 Unsteadiness on feet: Secondary | ICD-10-CM | POA: Diagnosis not present

## 2017-05-26 DIAGNOSIS — R293 Abnormal posture: Secondary | ICD-10-CM | POA: Diagnosis not present

## 2017-05-26 DIAGNOSIS — R2689 Other abnormalities of gait and mobility: Secondary | ICD-10-CM | POA: Diagnosis not present

## 2017-05-28 DIAGNOSIS — R2681 Unsteadiness on feet: Secondary | ICD-10-CM | POA: Diagnosis not present

## 2017-05-28 DIAGNOSIS — M25562 Pain in left knee: Secondary | ICD-10-CM | POA: Diagnosis not present

## 2017-05-28 DIAGNOSIS — M6281 Muscle weakness (generalized): Secondary | ICD-10-CM | POA: Diagnosis not present

## 2017-05-28 DIAGNOSIS — R2689 Other abnormalities of gait and mobility: Secondary | ICD-10-CM | POA: Diagnosis not present

## 2017-05-28 DIAGNOSIS — M25561 Pain in right knee: Secondary | ICD-10-CM | POA: Diagnosis not present

## 2017-05-28 DIAGNOSIS — R293 Abnormal posture: Secondary | ICD-10-CM | POA: Diagnosis not present

## 2017-06-02 DIAGNOSIS — R293 Abnormal posture: Secondary | ICD-10-CM | POA: Diagnosis not present

## 2017-06-02 DIAGNOSIS — M25561 Pain in right knee: Secondary | ICD-10-CM | POA: Diagnosis not present

## 2017-06-02 DIAGNOSIS — M6281 Muscle weakness (generalized): Secondary | ICD-10-CM | POA: Diagnosis not present

## 2017-06-02 DIAGNOSIS — R2689 Other abnormalities of gait and mobility: Secondary | ICD-10-CM | POA: Diagnosis not present

## 2017-06-02 DIAGNOSIS — R2681 Unsteadiness on feet: Secondary | ICD-10-CM | POA: Diagnosis not present

## 2017-06-02 DIAGNOSIS — M25562 Pain in left knee: Secondary | ICD-10-CM | POA: Diagnosis not present

## 2017-06-03 ENCOUNTER — Other Ambulatory Visit: Payer: Self-pay | Admitting: Neurology

## 2017-06-04 DIAGNOSIS — Z1231 Encounter for screening mammogram for malignant neoplasm of breast: Secondary | ICD-10-CM | POA: Diagnosis not present

## 2017-06-08 ENCOUNTER — Ambulatory Visit: Payer: Medicare Other | Admitting: Adult Health

## 2017-06-10 DIAGNOSIS — R293 Abnormal posture: Secondary | ICD-10-CM | POA: Diagnosis not present

## 2017-06-10 DIAGNOSIS — M6281 Muscle weakness (generalized): Secondary | ICD-10-CM | POA: Diagnosis not present

## 2017-06-10 DIAGNOSIS — M25562 Pain in left knee: Secondary | ICD-10-CM | POA: Diagnosis not present

## 2017-06-10 DIAGNOSIS — R2681 Unsteadiness on feet: Secondary | ICD-10-CM | POA: Diagnosis not present

## 2017-06-10 DIAGNOSIS — R2689 Other abnormalities of gait and mobility: Secondary | ICD-10-CM | POA: Diagnosis not present

## 2017-06-10 DIAGNOSIS — M25561 Pain in right knee: Secondary | ICD-10-CM | POA: Diagnosis not present

## 2017-06-15 DIAGNOSIS — R252 Cramp and spasm: Secondary | ICD-10-CM | POA: Diagnosis not present

## 2017-06-15 DIAGNOSIS — R05 Cough: Secondary | ICD-10-CM | POA: Diagnosis not present

## 2017-06-15 DIAGNOSIS — R6883 Chills (without fever): Secondary | ICD-10-CM | POA: Diagnosis not present

## 2017-06-15 DIAGNOSIS — R531 Weakness: Secondary | ICD-10-CM | POA: Diagnosis not present

## 2017-06-17 DIAGNOSIS — R928 Other abnormal and inconclusive findings on diagnostic imaging of breast: Secondary | ICD-10-CM | POA: Diagnosis not present

## 2017-06-17 DIAGNOSIS — R922 Inconclusive mammogram: Secondary | ICD-10-CM | POA: Diagnosis not present

## 2017-07-01 DIAGNOSIS — N926 Irregular menstruation, unspecified: Secondary | ICD-10-CM | POA: Diagnosis not present

## 2017-07-01 DIAGNOSIS — N951 Menopausal and female climacteric states: Secondary | ICD-10-CM | POA: Diagnosis not present

## 2017-07-02 DIAGNOSIS — N3090 Cystitis, unspecified without hematuria: Secondary | ICD-10-CM | POA: Diagnosis not present

## 2017-07-02 DIAGNOSIS — N201 Calculus of ureter: Secondary | ICD-10-CM | POA: Diagnosis not present

## 2017-07-02 DIAGNOSIS — N3020 Other chronic cystitis without hematuria: Secondary | ICD-10-CM | POA: Diagnosis not present

## 2017-07-05 DIAGNOSIS — N201 Calculus of ureter: Secondary | ICD-10-CM | POA: Diagnosis not present

## 2017-07-05 DIAGNOSIS — N3020 Other chronic cystitis without hematuria: Secondary | ICD-10-CM | POA: Diagnosis not present

## 2017-07-08 DIAGNOSIS — N201 Calculus of ureter: Secondary | ICD-10-CM | POA: Diagnosis not present

## 2017-07-08 DIAGNOSIS — J45909 Unspecified asthma, uncomplicated: Secondary | ICD-10-CM | POA: Diagnosis not present

## 2017-07-08 DIAGNOSIS — R110 Nausea: Secondary | ICD-10-CM | POA: Diagnosis not present

## 2017-07-08 DIAGNOSIS — N1330 Unspecified hydronephrosis: Secondary | ICD-10-CM | POA: Diagnosis not present

## 2017-07-08 DIAGNOSIS — D649 Anemia, unspecified: Secondary | ICD-10-CM | POA: Diagnosis not present

## 2017-07-08 DIAGNOSIS — Z87891 Personal history of nicotine dependence: Secondary | ICD-10-CM | POA: Diagnosis not present

## 2017-07-08 DIAGNOSIS — R51 Headache: Secondary | ICD-10-CM | POA: Diagnosis not present

## 2017-07-08 DIAGNOSIS — R0602 Shortness of breath: Secondary | ICD-10-CM | POA: Diagnosis not present

## 2017-07-08 DIAGNOSIS — Z79899 Other long term (current) drug therapy: Secondary | ICD-10-CM | POA: Diagnosis not present

## 2017-07-13 DIAGNOSIS — N3000 Acute cystitis without hematuria: Secondary | ICD-10-CM | POA: Diagnosis not present

## 2017-07-13 DIAGNOSIS — N201 Calculus of ureter: Secondary | ICD-10-CM | POA: Diagnosis not present

## 2017-07-19 DIAGNOSIS — Z79899 Other long term (current) drug therapy: Secondary | ICD-10-CM | POA: Diagnosis not present

## 2017-07-24 DIAGNOSIS — S00451A Superficial foreign body of right ear, initial encounter: Secondary | ICD-10-CM | POA: Diagnosis not present

## 2017-08-02 DIAGNOSIS — R05 Cough: Secondary | ICD-10-CM | POA: Diagnosis not present

## 2017-08-02 DIAGNOSIS — J00 Acute nasopharyngitis [common cold]: Secondary | ICD-10-CM | POA: Diagnosis not present

## 2017-08-05 ENCOUNTER — Ambulatory Visit: Payer: Medicare Other | Admitting: Adult Health

## 2017-08-05 DIAGNOSIS — N201 Calculus of ureter: Secondary | ICD-10-CM | POA: Diagnosis not present

## 2017-08-05 DIAGNOSIS — N3020 Other chronic cystitis without hematuria: Secondary | ICD-10-CM | POA: Diagnosis not present

## 2017-08-12 DIAGNOSIS — G43909 Migraine, unspecified, not intractable, without status migrainosus: Secondary | ICD-10-CM | POA: Diagnosis not present

## 2017-08-12 DIAGNOSIS — R12 Heartburn: Secondary | ICD-10-CM | POA: Diagnosis not present

## 2017-08-12 DIAGNOSIS — J40 Bronchitis, not specified as acute or chronic: Secondary | ICD-10-CM | POA: Diagnosis not present

## 2017-08-12 DIAGNOSIS — F419 Anxiety disorder, unspecified: Secondary | ICD-10-CM | POA: Diagnosis not present

## 2017-08-15 DIAGNOSIS — J189 Pneumonia, unspecified organism: Secondary | ICD-10-CM | POA: Diagnosis not present

## 2017-08-15 DIAGNOSIS — J209 Acute bronchitis, unspecified: Secondary | ICD-10-CM | POA: Diagnosis not present

## 2017-08-31 DIAGNOSIS — H524 Presbyopia: Secondary | ICD-10-CM | POA: Diagnosis not present

## 2017-08-31 DIAGNOSIS — H5203 Hypermetropia, bilateral: Secondary | ICD-10-CM | POA: Diagnosis not present

## 2017-08-31 DIAGNOSIS — H52209 Unspecified astigmatism, unspecified eye: Secondary | ICD-10-CM | POA: Diagnosis not present

## 2017-09-13 DIAGNOSIS — Z720 Tobacco use: Secondary | ICD-10-CM | POA: Diagnosis not present

## 2017-09-13 DIAGNOSIS — R05 Cough: Secondary | ICD-10-CM | POA: Diagnosis not present

## 2017-09-13 DIAGNOSIS — Z5181 Encounter for therapeutic drug level monitoring: Secondary | ICD-10-CM | POA: Diagnosis not present

## 2017-09-13 DIAGNOSIS — Z79899 Other long term (current) drug therapy: Secondary | ICD-10-CM | POA: Diagnosis not present

## 2017-09-13 DIAGNOSIS — F411 Generalized anxiety disorder: Secondary | ICD-10-CM | POA: Diagnosis not present

## 2017-09-13 DIAGNOSIS — G43909 Migraine, unspecified, not intractable, without status migrainosus: Secondary | ICD-10-CM | POA: Diagnosis not present

## 2017-10-07 DIAGNOSIS — Z79899 Other long term (current) drug therapy: Secondary | ICD-10-CM | POA: Diagnosis not present

## 2017-10-07 DIAGNOSIS — J329 Chronic sinusitis, unspecified: Secondary | ICD-10-CM | POA: Diagnosis not present

## 2017-10-12 DIAGNOSIS — Z79899 Other long term (current) drug therapy: Secondary | ICD-10-CM | POA: Diagnosis not present

## 2017-10-12 DIAGNOSIS — Z5181 Encounter for therapeutic drug level monitoring: Secondary | ICD-10-CM | POA: Diagnosis not present

## 2017-10-12 DIAGNOSIS — F419 Anxiety disorder, unspecified: Secondary | ICD-10-CM | POA: Diagnosis not present

## 2017-10-21 ENCOUNTER — Other Ambulatory Visit: Payer: Self-pay | Admitting: Neurology

## 2017-10-21 NOTE — Telephone Encounter (Signed)
Faxed printed/signed rx rizatriptan to [facility] in [place] at [phone]. Received fax confirmation.

## 2017-10-29 DIAGNOSIS — G43909 Migraine, unspecified, not intractable, without status migrainosus: Secondary | ICD-10-CM | POA: Diagnosis not present

## 2017-11-09 DIAGNOSIS — F319 Bipolar disorder, unspecified: Secondary | ICD-10-CM | POA: Diagnosis not present

## 2017-11-09 DIAGNOSIS — Z5181 Encounter for therapeutic drug level monitoring: Secondary | ICD-10-CM | POA: Diagnosis not present

## 2017-11-09 DIAGNOSIS — F411 Generalized anxiety disorder: Secondary | ICD-10-CM | POA: Diagnosis not present

## 2017-11-09 DIAGNOSIS — Z79899 Other long term (current) drug therapy: Secondary | ICD-10-CM | POA: Diagnosis not present

## 2017-11-09 DIAGNOSIS — R300 Dysuria: Secondary | ICD-10-CM | POA: Diagnosis not present

## 2017-11-09 DIAGNOSIS — N390 Urinary tract infection, site not specified: Secondary | ICD-10-CM | POA: Diagnosis not present

## 2017-11-12 DIAGNOSIS — N390 Urinary tract infection, site not specified: Secondary | ICD-10-CM | POA: Diagnosis not present

## 2017-11-12 DIAGNOSIS — G43411 Hemiplegic migraine, intractable, with status migrainosus: Secondary | ICD-10-CM | POA: Diagnosis not present

## 2017-11-12 DIAGNOSIS — E7800 Pure hypercholesterolemia, unspecified: Secondary | ICD-10-CM | POA: Diagnosis not present

## 2017-11-12 DIAGNOSIS — R4781 Slurred speech: Secondary | ICD-10-CM | POA: Diagnosis not present

## 2017-11-12 DIAGNOSIS — R531 Weakness: Secondary | ICD-10-CM | POA: Diagnosis not present

## 2017-11-12 DIAGNOSIS — G43409 Hemiplegic migraine, not intractable, without status migrainosus: Secondary | ICD-10-CM | POA: Diagnosis not present

## 2017-11-12 DIAGNOSIS — E876 Hypokalemia: Secondary | ICD-10-CM | POA: Diagnosis not present

## 2017-11-12 DIAGNOSIS — G43809 Other migraine, not intractable, without status migrainosus: Secondary | ICD-10-CM | POA: Diagnosis not present

## 2017-11-12 DIAGNOSIS — R918 Other nonspecific abnormal finding of lung field: Secondary | ICD-10-CM | POA: Diagnosis not present

## 2017-11-12 DIAGNOSIS — F319 Bipolar disorder, unspecified: Secondary | ICD-10-CM | POA: Diagnosis not present

## 2017-11-12 DIAGNOSIS — E785 Hyperlipidemia, unspecified: Secondary | ICD-10-CM | POA: Diagnosis not present

## 2017-11-12 DIAGNOSIS — I639 Cerebral infarction, unspecified: Secondary | ICD-10-CM | POA: Diagnosis not present

## 2017-11-12 DIAGNOSIS — G43109 Migraine with aura, not intractable, without status migrainosus: Secondary | ICD-10-CM | POA: Diagnosis not present

## 2017-11-12 DIAGNOSIS — F17210 Nicotine dependence, cigarettes, uncomplicated: Secondary | ICD-10-CM | POA: Diagnosis not present

## 2017-11-12 DIAGNOSIS — E860 Dehydration: Secondary | ICD-10-CM | POA: Diagnosis not present

## 2017-11-12 DIAGNOSIS — I6789 Other cerebrovascular disease: Secondary | ICD-10-CM | POA: Diagnosis not present

## 2017-11-12 DIAGNOSIS — Z720 Tobacco use: Secondary | ICD-10-CM | POA: Diagnosis not present

## 2017-11-12 DIAGNOSIS — Z79899 Other long term (current) drug therapy: Secondary | ICD-10-CM | POA: Diagnosis not present

## 2017-11-19 ENCOUNTER — Other Ambulatory Visit: Payer: Self-pay

## 2017-11-19 NOTE — Patient Outreach (Signed)
[facility]) Care Management  11/19/2017  Kara AlbrightCarren A Weaver [birth year] [id number]   Referral received on 11/18/17: patient discharged from an inpatient admission from [facility] on 11/12/17. RNCM called regarding transition of care.   RNCM received immediate call back from client. Two patient identifiers confirmed. Client reports she was hospitalized with "a hemiplegic migraine". She denies any issues at this time. She reports she has a follow up appointment with her provider on Monday 11/22/17. She states she has transportation to her appointments.  Per patient she has a history of Bipolar, obsessive compulsive disorder and Panic attacks. She reports she is being managed by psychiatrist.  She reports she lives with her parents and help to take care of her parents. She declines transition of care follow up by nursing and denies any social work needs. However she states her insurance provider has denied nausea medication. She reports she is on disability and was prescribed two medications for her migraines and states her nausea medication was denied. She is not able to state the names of the medications she is on.  Plan: referral to pharmacy to follow up.  Kara SheriffJuana Ilhan Debenedetto, RN, MSN, [facility],CCM [facility] Community Care Coordinator Cell: [phone]

## 2017-11-22 DIAGNOSIS — F909 Attention-deficit hyperactivity disorder, unspecified type: Secondary | ICD-10-CM | POA: Diagnosis not present

## 2017-11-22 DIAGNOSIS — N390 Urinary tract infection, site not specified: Secondary | ICD-10-CM | POA: Diagnosis not present

## 2017-11-22 DIAGNOSIS — G43909 Migraine, unspecified, not intractable, without status migrainosus: Secondary | ICD-10-CM | POA: Diagnosis not present

## 2017-11-23 ENCOUNTER — Other Ambulatory Visit: Payer: Self-pay

## 2017-11-23 NOTE — Patient Outreach (Signed)
[facility]) Care Management  11/23/2017  Kara FaviaCarren A Weaver May 21, 1971 [id number]   47 year old [facility] for nausea medication.  [facility] for ondansetron.  PMHx includes, but not limited to migraine.  Unsuccessful outreach attempt #1 to [name].  Left HIPAA compliant voice message asking for return call.   Incoming call received from [name]. HIPAA identifiers verified.    Subjective:  During incoming phone call, patient reports that she had gotten a call from her pharmacy informing her that her prescription for nausea was ready for pick up.  Patient reports that she has been referred to another neurologist for a second opinion for her recurrent migraines.    Medication Assistance: Per referral note, patient's nausea prescription (patient could not remember the name of drug) was denied by insurance and she can't afford to pay out of pocket.  Call placed to her pharmacy, [facility], this morning and the pharmacist said the only nausea medication on file was ondansetron 4 mg tablet.  Pharmacist states that she is able to process it today through insurance and that is ready for pick up.    Patient is pleased her prescription is ready.  Patient verbalized there are no other medication needs at this time.  She states she will call me back in the future if she has a medication she can't afford.  I informed patient that I will close her [facility] case, but will be happy to assist in the future if medication needs arise.  Plan: Route case closure letter to J.  Derrick RavelMawoneke, NP.    Berlin HunJennifer Lilliane Sposito, PharmD Clinical Pharmacist [facility] [phone]

## 2017-11-26 ENCOUNTER — Other Ambulatory Visit: Payer: Self-pay | Admitting: Neurology

## 2017-12-10 DIAGNOSIS — R350 Frequency of micturition: Secondary | ICD-10-CM | POA: Diagnosis not present

## 2017-12-10 DIAGNOSIS — F909 Attention-deficit hyperactivity disorder, unspecified type: Secondary | ICD-10-CM | POA: Diagnosis not present

## 2017-12-10 DIAGNOSIS — F419 Anxiety disorder, unspecified: Secondary | ICD-10-CM | POA: Diagnosis not present

## 2017-12-10 DIAGNOSIS — Z5181 Encounter for therapeutic drug level monitoring: Secondary | ICD-10-CM | POA: Diagnosis not present

## 2017-12-10 DIAGNOSIS — Z79899 Other long term (current) drug therapy: Secondary | ICD-10-CM | POA: Diagnosis not present

## 2017-12-17 ENCOUNTER — Other Ambulatory Visit: Payer: Self-pay | Admitting: Neurology

## 2018-01-06 DIAGNOSIS — F319 Bipolar disorder, unspecified: Secondary | ICD-10-CM | POA: Diagnosis not present

## 2018-01-14 DIAGNOSIS — F4310 Post-traumatic stress disorder, unspecified: Secondary | ICD-10-CM | POA: Diagnosis not present

## 2018-01-21 DIAGNOSIS — F419 Anxiety disorder, unspecified: Secondary | ICD-10-CM | POA: Diagnosis not present

## 2018-01-21 DIAGNOSIS — F909 Attention-deficit hyperactivity disorder, unspecified type: Secondary | ICD-10-CM | POA: Diagnosis not present

## 2018-01-21 DIAGNOSIS — R350 Frequency of micturition: Secondary | ICD-10-CM | POA: Diagnosis not present

## 2018-01-21 DIAGNOSIS — Z79899 Other long term (current) drug therapy: Secondary | ICD-10-CM | POA: Diagnosis not present

## 2018-01-21 DIAGNOSIS — Z5181 Encounter for therapeutic drug level monitoring: Secondary | ICD-10-CM | POA: Diagnosis not present

## 2018-02-04 DIAGNOSIS — F319 Bipolar disorder, unspecified: Secondary | ICD-10-CM | POA: Diagnosis not present

## 2018-02-18 DIAGNOSIS — Z79899 Other long term (current) drug therapy: Secondary | ICD-10-CM | POA: Diagnosis not present

## 2018-02-18 DIAGNOSIS — Z5181 Encounter for therapeutic drug level monitoring: Secondary | ICD-10-CM | POA: Diagnosis not present

## 2018-02-18 DIAGNOSIS — F419 Anxiety disorder, unspecified: Secondary | ICD-10-CM | POA: Diagnosis not present

## 2018-02-18 DIAGNOSIS — F902 Attention-deficit hyperactivity disorder, combined type: Secondary | ICD-10-CM | POA: Diagnosis not present

## 2018-02-18 DIAGNOSIS — G43509 Persistent migraine aura without cerebral infarction, not intractable, without status migrainosus: Secondary | ICD-10-CM | POA: Diagnosis not present

## 2018-03-11 DIAGNOSIS — F319 Bipolar disorder, unspecified: Secondary | ICD-10-CM | POA: Diagnosis not present

## 2018-03-18 ENCOUNTER — Other Ambulatory Visit: Payer: Self-pay | Admitting: *Deleted

## 2018-03-18 DIAGNOSIS — Z79899 Other long term (current) drug therapy: Secondary | ICD-10-CM | POA: Diagnosis not present

## 2018-03-18 DIAGNOSIS — Z5181 Encounter for therapeutic drug level monitoring: Secondary | ICD-10-CM | POA: Diagnosis not present

## 2018-03-18 DIAGNOSIS — G43001 Migraine without aura, not intractable, with status migrainosus: Secondary | ICD-10-CM | POA: Diagnosis not present

## 2018-03-18 DIAGNOSIS — F909 Attention-deficit hyperactivity disorder, unspecified type: Secondary | ICD-10-CM | POA: Diagnosis not present

## 2018-03-18 DIAGNOSIS — F419 Anxiety disorder, unspecified: Secondary | ICD-10-CM | POA: Diagnosis not present

## 2018-03-18 NOTE — Patient Outreach (Signed)
[facility] Care Management  03/18/2018  Kara AlbrightCarren A Weaver 1970-10-22 [id number]   Initial unsuccessful telephone outreach to member to complete [employer] health risk assessment screening. Left message on contact number requesting return call.  Bary RichardJanet S. Asani Deniston RN,CCM,CDE [facility] Care Management Coordinator Office Phone [phone] Office Fax [phone]

## 2018-03-25 ENCOUNTER — Other Ambulatory Visit: Payer: Self-pay | Admitting: *Deleted

## 2018-03-25 NOTE — Patient Outreach (Addendum)
[facility]) Care Management  03/25/2018  Kara Weaver [birth year] [id number]   Second telephone outreach successful in completing [employer] health risk assessment screening. 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 She says she has chronic asthma and bronchitis and had a dyspneic episode that awakened her from sleep last night that she was able to self treat with her rescue inhaler. She denies any other recent exacerbations. She says she is interested in getting a home nebulizer but would like to know the out of pocket cost before she speaks to her provider about it. Advised Kara Weaver this RNCM will contact her [employer] concierge and request that the concierge contact Kara Weaver with the nebulizer information. Kara Weaver declined the offer of a Health Coach for her chronic  lung problems.  Kara Weaver says she was diagnosed with bipolar disorder about 20 years ago. She says she also has ADHD, OCD, and PTSD. She states she is adherent with her medications and says she sees a psychiatrist on a regular basis. She declined the offer of a [facility] Care Management licensed clinical social worker for supplemental counseling.  Kara Weaver is interested in completing an advance directive for herself and her parents so will mail 3 advance directive packets along with a successful outreach letter with a program brochure and 24 Hour Nurse Advice Line magnet.  Kara RichardJanet S. Hauser RN,CCM,CDE [facility] Care Management Coordinator Office Phone [phone] Office Fax [phone]

## 2018-04-05 DIAGNOSIS — J4521 Mild intermittent asthma with (acute) exacerbation: Secondary | ICD-10-CM | POA: Diagnosis not present

## 2018-04-05 DIAGNOSIS — G629 Polyneuropathy, unspecified: Secondary | ICD-10-CM | POA: Diagnosis not present

## 2018-04-05 DIAGNOSIS — R300 Dysuria: Secondary | ICD-10-CM | POA: Diagnosis not present

## 2018-04-05 DIAGNOSIS — R05 Cough: Secondary | ICD-10-CM | POA: Diagnosis not present

## 2018-04-05 DIAGNOSIS — E559 Vitamin D deficiency, unspecified: Secondary | ICD-10-CM | POA: Diagnosis not present

## 2018-04-18 DIAGNOSIS — Z23 Encounter for immunization: Secondary | ICD-10-CM | POA: Diagnosis not present

## 2018-04-18 DIAGNOSIS — F902 Attention-deficit hyperactivity disorder, combined type: Secondary | ICD-10-CM | POA: Diagnosis not present

## 2018-04-18 DIAGNOSIS — N390 Urinary tract infection, site not specified: Secondary | ICD-10-CM | POA: Diagnosis not present

## 2018-04-18 DIAGNOSIS — M545 Low back pain: Secondary | ICD-10-CM | POA: Diagnosis not present

## 2018-04-18 DIAGNOSIS — F419 Anxiety disorder, unspecified: Secondary | ICD-10-CM | POA: Diagnosis not present

## 2018-04-18 DIAGNOSIS — Z79899 Other long term (current) drug therapy: Secondary | ICD-10-CM | POA: Diagnosis not present

## 2018-04-18 DIAGNOSIS — G43909 Migraine, unspecified, not intractable, without status migrainosus: Secondary | ICD-10-CM | POA: Diagnosis not present

## 2018-04-18 DIAGNOSIS — Z5181 Encounter for therapeutic drug level monitoring: Secondary | ICD-10-CM | POA: Diagnosis not present

## 2018-05-12 DIAGNOSIS — J45909 Unspecified asthma, uncomplicated: Secondary | ICD-10-CM | POA: Diagnosis not present

## 2018-05-12 DIAGNOSIS — Z0000 Encounter for general adult medical examination without abnormal findings: Secondary | ICD-10-CM | POA: Diagnosis not present

## 2018-05-12 DIAGNOSIS — E781 Pure hyperglyceridemia: Secondary | ICD-10-CM | POA: Diagnosis not present

## 2018-05-12 DIAGNOSIS — F329 Major depressive disorder, single episode, unspecified: Secondary | ICD-10-CM | POA: Diagnosis not present

## 2018-05-12 DIAGNOSIS — Z1389 Encounter for screening for other disorder: Secondary | ICD-10-CM | POA: Diagnosis not present

## 2018-05-12 DIAGNOSIS — F319 Bipolar disorder, unspecified: Secondary | ICD-10-CM | POA: Diagnosis not present

## 2018-05-12 DIAGNOSIS — G43909 Migraine, unspecified, not intractable, without status migrainosus: Secondary | ICD-10-CM | POA: Diagnosis not present

## 2018-05-12 DIAGNOSIS — M549 Dorsalgia, unspecified: Secondary | ICD-10-CM | POA: Diagnosis not present

## 2018-05-12 DIAGNOSIS — F411 Generalized anxiety disorder: Secondary | ICD-10-CM | POA: Diagnosis not present

## 2018-05-12 DIAGNOSIS — M25569 Pain in unspecified knee: Secondary | ICD-10-CM | POA: Diagnosis not present

## 2018-05-16 DIAGNOSIS — F411 Generalized anxiety disorder: Secondary | ICD-10-CM | POA: Diagnosis not present

## 2018-05-16 DIAGNOSIS — Z5181 Encounter for therapeutic drug level monitoring: Secondary | ICD-10-CM | POA: Diagnosis not present

## 2018-05-16 DIAGNOSIS — F909 Attention-deficit hyperactivity disorder, unspecified type: Secondary | ICD-10-CM | POA: Diagnosis not present

## 2018-05-16 DIAGNOSIS — Z79899 Other long term (current) drug therapy: Secondary | ICD-10-CM | POA: Diagnosis not present

## 2018-06-05 ENCOUNTER — Other Ambulatory Visit: Payer: Self-pay | Admitting: Neurology

## 2018-06-10 DIAGNOSIS — F319 Bipolar disorder, unspecified: Secondary | ICD-10-CM | POA: Diagnosis not present

## 2018-06-17 DIAGNOSIS — F909 Attention-deficit hyperactivity disorder, unspecified type: Secondary | ICD-10-CM | POA: Diagnosis not present

## 2018-06-17 DIAGNOSIS — Z79899 Other long term (current) drug therapy: Secondary | ICD-10-CM | POA: Diagnosis not present

## 2018-06-17 DIAGNOSIS — Z5181 Encounter for therapeutic drug level monitoring: Secondary | ICD-10-CM | POA: Diagnosis not present

## 2018-06-17 DIAGNOSIS — J329 Chronic sinusitis, unspecified: Secondary | ICD-10-CM | POA: Diagnosis not present

## 2018-06-17 DIAGNOSIS — F411 Generalized anxiety disorder: Secondary | ICD-10-CM | POA: Diagnosis not present

## 2018-06-22 DIAGNOSIS — F319 Bipolar disorder, unspecified: Secondary | ICD-10-CM | POA: Diagnosis not present

## 2018-06-22 DIAGNOSIS — G43709 Chronic migraine without aura, not intractable, without status migrainosus: Secondary | ICD-10-CM | POA: Diagnosis not present

## 2018-06-29 DIAGNOSIS — J45909 Unspecified asthma, uncomplicated: Secondary | ICD-10-CM | POA: Diagnosis not present

## 2018-06-29 DIAGNOSIS — R05 Cough: Secondary | ICD-10-CM | POA: Diagnosis not present

## 2018-06-29 DIAGNOSIS — J329 Chronic sinusitis, unspecified: Secondary | ICD-10-CM | POA: Diagnosis not present

## 2018-07-13 DIAGNOSIS — F319 Bipolar disorder, unspecified: Secondary | ICD-10-CM | POA: Diagnosis not present

## 2018-07-15 DIAGNOSIS — M25512 Pain in left shoulder: Secondary | ICD-10-CM | POA: Diagnosis not present

## 2018-07-15 DIAGNOSIS — F411 Generalized anxiety disorder: Secondary | ICD-10-CM | POA: Diagnosis not present

## 2018-07-15 DIAGNOSIS — Z5181 Encounter for therapeutic drug level monitoring: Secondary | ICD-10-CM | POA: Diagnosis not present

## 2018-07-15 DIAGNOSIS — Z1231 Encounter for screening mammogram for malignant neoplasm of breast: Secondary | ICD-10-CM | POA: Diagnosis not present

## 2018-07-15 DIAGNOSIS — F909 Attention-deficit hyperactivity disorder, unspecified type: Secondary | ICD-10-CM | POA: Diagnosis not present

## 2018-07-15 DIAGNOSIS — G8929 Other chronic pain: Secondary | ICD-10-CM | POA: Diagnosis not present

## 2018-07-15 DIAGNOSIS — Z79899 Other long term (current) drug therapy: Secondary | ICD-10-CM | POA: Diagnosis not present

## 2018-08-05 DIAGNOSIS — F411 Generalized anxiety disorder: Secondary | ICD-10-CM | POA: Diagnosis not present

## 2018-08-05 DIAGNOSIS — F410 Panic disorder [episodic paroxysmal anxiety] without agoraphobia: Secondary | ICD-10-CM | POA: Diagnosis not present

## 2018-08-12 DIAGNOSIS — F410 Panic disorder [episodic paroxysmal anxiety] without agoraphobia: Secondary | ICD-10-CM | POA: Diagnosis not present

## 2018-08-12 DIAGNOSIS — G43909 Migraine, unspecified, not intractable, without status migrainosus: Secondary | ICD-10-CM | POA: Diagnosis not present

## 2018-08-12 DIAGNOSIS — R350 Frequency of micturition: Secondary | ICD-10-CM | POA: Diagnosis not present

## 2018-08-12 DIAGNOSIS — Z5181 Encounter for therapeutic drug level monitoring: Secondary | ICD-10-CM | POA: Diagnosis not present

## 2018-08-12 DIAGNOSIS — R109 Unspecified abdominal pain: Secondary | ICD-10-CM | POA: Diagnosis not present

## 2018-08-12 DIAGNOSIS — F411 Generalized anxiety disorder: Secondary | ICD-10-CM | POA: Diagnosis not present

## 2018-08-12 DIAGNOSIS — Z79899 Other long term (current) drug therapy: Secondary | ICD-10-CM | POA: Diagnosis not present

## 2018-08-14 DIAGNOSIS — N200 Calculus of kidney: Secondary | ICD-10-CM | POA: Diagnosis not present

## 2018-08-14 DIAGNOSIS — N390 Urinary tract infection, site not specified: Secondary | ICD-10-CM | POA: Diagnosis not present

## 2018-08-14 DIAGNOSIS — B9689 Other specified bacterial agents as the cause of diseases classified elsewhere: Secondary | ICD-10-CM | POA: Diagnosis not present

## 2018-08-14 DIAGNOSIS — F17210 Nicotine dependence, cigarettes, uncomplicated: Secondary | ICD-10-CM | POA: Diagnosis not present

## 2018-08-14 DIAGNOSIS — E876 Hypokalemia: Secondary | ICD-10-CM | POA: Diagnosis not present

## 2018-09-02 DIAGNOSIS — F411 Generalized anxiety disorder: Secondary | ICD-10-CM | POA: Diagnosis not present

## 2018-09-09 DIAGNOSIS — Z5181 Encounter for therapeutic drug level monitoring: Secondary | ICD-10-CM | POA: Diagnosis not present

## 2018-09-09 DIAGNOSIS — F419 Anxiety disorder, unspecified: Secondary | ICD-10-CM | POA: Diagnosis not present

## 2018-09-09 DIAGNOSIS — G43909 Migraine, unspecified, not intractable, without status migrainosus: Secondary | ICD-10-CM | POA: Diagnosis not present

## 2018-09-09 DIAGNOSIS — F909 Attention-deficit hyperactivity disorder, unspecified type: Secondary | ICD-10-CM | POA: Diagnosis not present

## 2018-09-09 DIAGNOSIS — Z79899 Other long term (current) drug therapy: Secondary | ICD-10-CM | POA: Diagnosis not present

## 2018-09-09 DIAGNOSIS — N200 Calculus of kidney: Secondary | ICD-10-CM | POA: Diagnosis not present

## 2018-09-20 DIAGNOSIS — N390 Urinary tract infection, site not specified: Secondary | ICD-10-CM | POA: Diagnosis not present

## 2018-09-30 DIAGNOSIS — J309 Allergic rhinitis, unspecified: Secondary | ICD-10-CM | POA: Diagnosis not present

## 2018-09-30 DIAGNOSIS — J069 Acute upper respiratory infection, unspecified: Secondary | ICD-10-CM | POA: Diagnosis not present

## 2018-09-30 DIAGNOSIS — B379 Candidiasis, unspecified: Secondary | ICD-10-CM | POA: Diagnosis not present

## 2018-10-07 DIAGNOSIS — N200 Calculus of kidney: Secondary | ICD-10-CM | POA: Diagnosis not present

## 2018-10-07 DIAGNOSIS — Z5181 Encounter for therapeutic drug level monitoring: Secondary | ICD-10-CM | POA: Diagnosis not present

## 2018-10-07 DIAGNOSIS — G43909 Migraine, unspecified, not intractable, without status migrainosus: Secondary | ICD-10-CM | POA: Diagnosis not present

## 2018-10-07 DIAGNOSIS — F909 Attention-deficit hyperactivity disorder, unspecified type: Secondary | ICD-10-CM | POA: Diagnosis not present

## 2018-10-07 DIAGNOSIS — F419 Anxiety disorder, unspecified: Secondary | ICD-10-CM | POA: Diagnosis not present

## 2018-10-07 DIAGNOSIS — Z79899 Other long term (current) drug therapy: Secondary | ICD-10-CM | POA: Diagnosis not present

## 2018-10-19 DIAGNOSIS — R05 Cough: Secondary | ICD-10-CM | POA: Diagnosis not present

## 2018-10-19 DIAGNOSIS — J309 Allergic rhinitis, unspecified: Secondary | ICD-10-CM | POA: Diagnosis not present

## 2018-10-19 DIAGNOSIS — J45909 Unspecified asthma, uncomplicated: Secondary | ICD-10-CM | POA: Diagnosis not present

## 2018-11-04 DIAGNOSIS — D510 Vitamin B12 deficiency anemia due to intrinsic factor deficiency: Secondary | ICD-10-CM | POA: Diagnosis not present

## 2018-11-04 DIAGNOSIS — R109 Unspecified abdominal pain: Secondary | ICD-10-CM | POA: Diagnosis not present

## 2018-11-04 DIAGNOSIS — F419 Anxiety disorder, unspecified: Secondary | ICD-10-CM | POA: Diagnosis not present

## 2018-11-04 DIAGNOSIS — D500 Iron deficiency anemia secondary to blood loss (chronic): Secondary | ICD-10-CM | POA: Diagnosis not present

## 2018-11-04 DIAGNOSIS — Z79899 Other long term (current) drug therapy: Secondary | ICD-10-CM | POA: Diagnosis not present

## 2018-11-04 DIAGNOSIS — Z5181 Encounter for therapeutic drug level monitoring: Secondary | ICD-10-CM | POA: Diagnosis not present

## 2018-11-04 DIAGNOSIS — F909 Attention-deficit hyperactivity disorder, unspecified type: Secondary | ICD-10-CM | POA: Diagnosis not present

## 2018-11-04 DIAGNOSIS — D631 Anemia in chronic kidney disease: Secondary | ICD-10-CM | POA: Diagnosis not present

## 2018-11-04 DIAGNOSIS — G43909 Migraine, unspecified, not intractable, without status migrainosus: Secondary | ICD-10-CM | POA: Diagnosis not present

## 2018-11-04 DIAGNOSIS — I2510 Atherosclerotic heart disease of native coronary artery without angina pectoris: Secondary | ICD-10-CM | POA: Diagnosis not present

## 2018-12-09 DIAGNOSIS — F909 Attention-deficit hyperactivity disorder, unspecified type: Secondary | ICD-10-CM | POA: Diagnosis not present

## 2018-12-30 DIAGNOSIS — F411 Generalized anxiety disorder: Secondary | ICD-10-CM | POA: Diagnosis not present

## 2018-12-30 DIAGNOSIS — F909 Attention-deficit hyperactivity disorder, unspecified type: Secondary | ICD-10-CM | POA: Diagnosis not present

## 2018-12-30 DIAGNOSIS — Z5181 Encounter for therapeutic drug level monitoring: Secondary | ICD-10-CM | POA: Diagnosis not present

## 2018-12-30 DIAGNOSIS — Z79899 Other long term (current) drug therapy: Secondary | ICD-10-CM | POA: Diagnosis not present

## 2019-01-30 DIAGNOSIS — Z5181 Encounter for therapeutic drug level monitoring: Secondary | ICD-10-CM | POA: Diagnosis not present

## 2019-01-30 DIAGNOSIS — Z79899 Other long term (current) drug therapy: Secondary | ICD-10-CM | POA: Diagnosis not present

## 2019-01-30 DIAGNOSIS — F909 Attention-deficit hyperactivity disorder, unspecified type: Secondary | ICD-10-CM | POA: Diagnosis not present

## 2019-01-30 DIAGNOSIS — J45909 Unspecified asthma, uncomplicated: Secondary | ICD-10-CM | POA: Diagnosis not present

## 2019-01-30 DIAGNOSIS — F419 Anxiety disorder, unspecified: Secondary | ICD-10-CM | POA: Diagnosis not present

## 2019-01-30 DIAGNOSIS — M549 Dorsalgia, unspecified: Secondary | ICD-10-CM | POA: Diagnosis not present

## 2019-02-10 DIAGNOSIS — M25511 Pain in right shoulder: Secondary | ICD-10-CM | POA: Diagnosis not present

## 2019-02-28 DIAGNOSIS — N390 Urinary tract infection, site not specified: Secondary | ICD-10-CM | POA: Diagnosis not present

## 2019-02-28 DIAGNOSIS — E782 Mixed hyperlipidemia: Secondary | ICD-10-CM | POA: Diagnosis not present

## 2019-02-28 DIAGNOSIS — Z79899 Other long term (current) drug therapy: Secondary | ICD-10-CM | POA: Diagnosis not present

## 2019-02-28 DIAGNOSIS — F909 Attention-deficit hyperactivity disorder, unspecified type: Secondary | ICD-10-CM | POA: Diagnosis not present

## 2019-02-28 DIAGNOSIS — Z5181 Encounter for therapeutic drug level monitoring: Secondary | ICD-10-CM | POA: Diagnosis not present

## 2019-02-28 DIAGNOSIS — F411 Generalized anxiety disorder: Secondary | ICD-10-CM | POA: Diagnosis not present

## 2019-03-09 DIAGNOSIS — R05 Cough: Secondary | ICD-10-CM | POA: Diagnosis not present

## 2019-03-09 DIAGNOSIS — J45909 Unspecified asthma, uncomplicated: Secondary | ICD-10-CM | POA: Diagnosis not present

## 2019-03-09 DIAGNOSIS — R634 Abnormal weight loss: Secondary | ICD-10-CM | POA: Diagnosis not present

## 2019-03-09 DIAGNOSIS — F419 Anxiety disorder, unspecified: Secondary | ICD-10-CM | POA: Diagnosis not present

## 2019-03-09 DIAGNOSIS — R635 Abnormal weight gain: Secondary | ICD-10-CM | POA: Diagnosis not present

## 2019-03-10 DIAGNOSIS — T50905A Adverse effect of unspecified drugs, medicaments and biological substances, initial encounter: Secondary | ICD-10-CM | POA: Diagnosis not present

## 2019-03-10 DIAGNOSIS — R682 Dry mouth, unspecified: Secondary | ICD-10-CM | POA: Diagnosis not present

## 2019-03-10 DIAGNOSIS — L298 Other pruritus: Secondary | ICD-10-CM | POA: Diagnosis not present

## 2019-03-17 DIAGNOSIS — N200 Calculus of kidney: Secondary | ICD-10-CM | POA: Diagnosis not present

## 2019-03-17 DIAGNOSIS — M940 Chondrocostal junction syndrome [Tietze]: Secondary | ICD-10-CM | POA: Diagnosis not present

## 2019-03-17 DIAGNOSIS — R05 Cough: Secondary | ICD-10-CM | POA: Diagnosis not present

## 2019-03-27 DIAGNOSIS — N390 Urinary tract infection, site not specified: Secondary | ICD-10-CM | POA: Diagnosis not present

## 2019-04-04 DIAGNOSIS — R112 Nausea with vomiting, unspecified: Secondary | ICD-10-CM | POA: Diagnosis not present

## 2019-04-04 DIAGNOSIS — Z5181 Encounter for therapeutic drug level monitoring: Secondary | ICD-10-CM | POA: Diagnosis not present

## 2019-04-04 DIAGNOSIS — F419 Anxiety disorder, unspecified: Secondary | ICD-10-CM | POA: Diagnosis not present

## 2019-04-04 DIAGNOSIS — N390 Urinary tract infection, site not specified: Secondary | ICD-10-CM | POA: Diagnosis not present

## 2019-04-04 DIAGNOSIS — F909 Attention-deficit hyperactivity disorder, unspecified type: Secondary | ICD-10-CM | POA: Diagnosis not present

## 2019-04-04 DIAGNOSIS — Z79899 Other long term (current) drug therapy: Secondary | ICD-10-CM | POA: Diagnosis not present

## 2019-05-02 DIAGNOSIS — G43909 Migraine, unspecified, not intractable, without status migrainosus: Secondary | ICD-10-CM | POA: Diagnosis not present

## 2019-05-02 DIAGNOSIS — Z5181 Encounter for therapeutic drug level monitoring: Secondary | ICD-10-CM | POA: Diagnosis not present

## 2019-05-02 DIAGNOSIS — F909 Attention-deficit hyperactivity disorder, unspecified type: Secondary | ICD-10-CM | POA: Diagnosis not present

## 2019-05-02 DIAGNOSIS — F419 Anxiety disorder, unspecified: Secondary | ICD-10-CM | POA: Diagnosis not present

## 2019-05-02 DIAGNOSIS — Z79899 Other long term (current) drug therapy: Secondary | ICD-10-CM | POA: Diagnosis not present

## 2019-05-15 DIAGNOSIS — R300 Dysuria: Secondary | ICD-10-CM | POA: Diagnosis not present

## 2019-05-15 DIAGNOSIS — G43119 Migraine with aura, intractable, without status migrainosus: Secondary | ICD-10-CM | POA: Diagnosis not present

## 2019-05-15 DIAGNOSIS — J301 Allergic rhinitis due to pollen: Secondary | ICD-10-CM | POA: Diagnosis not present

## 2019-05-15 DIAGNOSIS — F17200 Nicotine dependence, unspecified, uncomplicated: Secondary | ICD-10-CM | POA: Diagnosis not present

## 2019-05-15 DIAGNOSIS — Z720 Tobacco use: Secondary | ICD-10-CM | POA: Diagnosis not present

## 2019-05-30 DIAGNOSIS — J309 Allergic rhinitis, unspecified: Secondary | ICD-10-CM | POA: Diagnosis not present

## 2019-05-30 DIAGNOSIS — R300 Dysuria: Secondary | ICD-10-CM | POA: Diagnosis not present

## 2019-05-30 DIAGNOSIS — Z5181 Encounter for therapeutic drug level monitoring: Secondary | ICD-10-CM | POA: Diagnosis not present

## 2019-05-30 DIAGNOSIS — J329 Chronic sinusitis, unspecified: Secondary | ICD-10-CM | POA: Diagnosis not present

## 2019-05-30 DIAGNOSIS — F419 Anxiety disorder, unspecified: Secondary | ICD-10-CM | POA: Diagnosis not present

## 2019-05-30 DIAGNOSIS — Z79899 Other long term (current) drug therapy: Secondary | ICD-10-CM | POA: Diagnosis not present

## 2019-05-30 DIAGNOSIS — F909 Attention-deficit hyperactivity disorder, unspecified type: Secondary | ICD-10-CM | POA: Diagnosis not present

## 2019-06-07 DIAGNOSIS — R300 Dysuria: Secondary | ICD-10-CM | POA: Diagnosis not present

## 2019-06-12 DIAGNOSIS — J45901 Unspecified asthma with (acute) exacerbation: Secondary | ICD-10-CM | POA: Diagnosis not present

## 2019-06-27 DIAGNOSIS — K1370 Unspecified lesions of oral mucosa: Secondary | ICD-10-CM | POA: Diagnosis not present

## 2019-07-03 DIAGNOSIS — F319 Bipolar disorder, unspecified: Secondary | ICD-10-CM | POA: Diagnosis not present

## 2019-07-03 DIAGNOSIS — G43409 Hemiplegic migraine, not intractable, without status migrainosus: Secondary | ICD-10-CM | POA: Insufficient documentation

## 2019-07-04 DIAGNOSIS — R430 Anosmia: Secondary | ICD-10-CM | POA: Diagnosis not present

## 2019-07-04 DIAGNOSIS — Z5181 Encounter for therapeutic drug level monitoring: Secondary | ICD-10-CM | POA: Diagnosis not present

## 2019-07-04 DIAGNOSIS — F419 Anxiety disorder, unspecified: Secondary | ICD-10-CM | POA: Diagnosis not present

## 2019-07-04 DIAGNOSIS — F909 Attention-deficit hyperactivity disorder, unspecified type: Secondary | ICD-10-CM | POA: Diagnosis not present

## 2019-07-04 DIAGNOSIS — Z79899 Other long term (current) drug therapy: Secondary | ICD-10-CM | POA: Diagnosis not present

## 2019-07-12 DIAGNOSIS — J45901 Unspecified asthma with (acute) exacerbation: Secondary | ICD-10-CM | POA: Diagnosis not present

## 2019-07-19 DIAGNOSIS — S61531A Puncture wound without foreign body of right wrist, initial encounter: Secondary | ICD-10-CM | POA: Diagnosis not present

## 2019-07-19 DIAGNOSIS — S62111B Displaced fracture of triquetrum [cuneiform] bone, right wrist, initial encounter for open fracture: Secondary | ICD-10-CM | POA: Diagnosis not present

## 2019-07-19 DIAGNOSIS — Z23 Encounter for immunization: Secondary | ICD-10-CM | POA: Diagnosis not present

## 2019-07-19 DIAGNOSIS — W540XXA Bitten by dog, initial encounter: Secondary | ICD-10-CM | POA: Diagnosis not present

## 2019-07-19 DIAGNOSIS — S6991XA Unspecified injury of right wrist, hand and finger(s), initial encounter: Secondary | ICD-10-CM | POA: Diagnosis not present

## 2019-07-19 DIAGNOSIS — M7989 Other specified soft tissue disorders: Secondary | ICD-10-CM | POA: Diagnosis not present

## 2019-07-24 DIAGNOSIS — S61551A Open bite of right wrist, initial encounter: Secondary | ICD-10-CM | POA: Diagnosis not present

## 2019-07-25 DIAGNOSIS — S61551A Open bite of right wrist, initial encounter: Secondary | ICD-10-CM | POA: Diagnosis not present

## 2019-07-31 DIAGNOSIS — F319 Bipolar disorder, unspecified: Secondary | ICD-10-CM | POA: Diagnosis not present

## 2019-08-06 DIAGNOSIS — R109 Unspecified abdominal pain: Secondary | ICD-10-CM | POA: Diagnosis not present

## 2019-08-06 DIAGNOSIS — N200 Calculus of kidney: Secondary | ICD-10-CM | POA: Diagnosis not present

## 2019-08-06 DIAGNOSIS — Z5329 Procedure and treatment not carried out because of patient's decision for other reasons: Secondary | ICD-10-CM | POA: Diagnosis not present

## 2019-08-06 DIAGNOSIS — N201 Calculus of ureter: Secondary | ICD-10-CM | POA: Diagnosis not present

## 2019-08-07 DIAGNOSIS — Z79899 Other long term (current) drug therapy: Secondary | ICD-10-CM | POA: Diagnosis not present

## 2019-08-07 DIAGNOSIS — Z5181 Encounter for therapeutic drug level monitoring: Secondary | ICD-10-CM | POA: Diagnosis not present

## 2019-08-07 DIAGNOSIS — R509 Fever, unspecified: Secondary | ICD-10-CM | POA: Diagnosis not present

## 2019-08-07 DIAGNOSIS — Z1159 Encounter for screening for other viral diseases: Secondary | ICD-10-CM | POA: Diagnosis not present

## 2019-08-07 DIAGNOSIS — F909 Attention-deficit hyperactivity disorder, unspecified type: Secondary | ICD-10-CM | POA: Diagnosis not present

## 2019-08-07 DIAGNOSIS — R109 Unspecified abdominal pain: Secondary | ICD-10-CM | POA: Diagnosis not present

## 2019-08-12 DIAGNOSIS — J45901 Unspecified asthma with (acute) exacerbation: Secondary | ICD-10-CM | POA: Diagnosis not present

## 2019-08-14 DIAGNOSIS — N3090 Cystitis, unspecified without hematuria: Secondary | ICD-10-CM | POA: Diagnosis not present

## 2019-08-14 DIAGNOSIS — N200 Calculus of kidney: Secondary | ICD-10-CM | POA: Diagnosis not present

## 2019-08-14 DIAGNOSIS — N201 Calculus of ureter: Secondary | ICD-10-CM | POA: Diagnosis not present

## 2019-08-14 DIAGNOSIS — N3020 Other chronic cystitis without hematuria: Secondary | ICD-10-CM | POA: Diagnosis not present

## 2019-09-08 DIAGNOSIS — F419 Anxiety disorder, unspecified: Secondary | ICD-10-CM | POA: Diagnosis not present

## 2019-09-08 DIAGNOSIS — N200 Calculus of kidney: Secondary | ICD-10-CM | POA: Diagnosis not present

## 2019-09-08 DIAGNOSIS — Z79899 Other long term (current) drug therapy: Secondary | ICD-10-CM | POA: Diagnosis not present

## 2019-09-08 DIAGNOSIS — N390 Urinary tract infection, site not specified: Secondary | ICD-10-CM | POA: Diagnosis not present

## 2019-09-08 DIAGNOSIS — F909 Attention-deficit hyperactivity disorder, unspecified type: Secondary | ICD-10-CM | POA: Diagnosis not present

## 2019-09-08 DIAGNOSIS — Z5181 Encounter for therapeutic drug level monitoring: Secondary | ICD-10-CM | POA: Diagnosis not present

## 2019-09-12 DIAGNOSIS — J45901 Unspecified asthma with (acute) exacerbation: Secondary | ICD-10-CM | POA: Diagnosis not present

## 2019-09-13 DIAGNOSIS — F319 Bipolar disorder, unspecified: Secondary | ICD-10-CM | POA: Diagnosis not present

## 2019-10-04 DIAGNOSIS — Z1231 Encounter for screening mammogram for malignant neoplasm of breast: Secondary | ICD-10-CM | POA: Diagnosis not present

## 2019-10-09 DIAGNOSIS — Z79899 Other long term (current) drug therapy: Secondary | ICD-10-CM | POA: Diagnosis not present

## 2019-10-09 DIAGNOSIS — F909 Attention-deficit hyperactivity disorder, unspecified type: Secondary | ICD-10-CM | POA: Diagnosis not present

## 2019-10-09 DIAGNOSIS — F419 Anxiety disorder, unspecified: Secondary | ICD-10-CM | POA: Diagnosis not present

## 2019-10-09 DIAGNOSIS — Z5181 Encounter for therapeutic drug level monitoring: Secondary | ICD-10-CM | POA: Diagnosis not present

## 2019-10-09 DIAGNOSIS — J309 Allergic rhinitis, unspecified: Secondary | ICD-10-CM | POA: Diagnosis not present

## 2019-10-09 DIAGNOSIS — J329 Chronic sinusitis, unspecified: Secondary | ICD-10-CM | POA: Diagnosis not present

## 2019-10-10 DIAGNOSIS — J45901 Unspecified asthma with (acute) exacerbation: Secondary | ICD-10-CM | POA: Diagnosis not present

## 2019-11-07 DIAGNOSIS — F419 Anxiety disorder, unspecified: Secondary | ICD-10-CM | POA: Diagnosis not present

## 2019-11-07 DIAGNOSIS — F909 Attention-deficit hyperactivity disorder, unspecified type: Secondary | ICD-10-CM | POA: Diagnosis not present

## 2019-11-07 DIAGNOSIS — Z5181 Encounter for therapeutic drug level monitoring: Secondary | ICD-10-CM | POA: Diagnosis not present

## 2019-11-07 DIAGNOSIS — Z79899 Other long term (current) drug therapy: Secondary | ICD-10-CM | POA: Diagnosis not present

## 2019-11-07 DIAGNOSIS — N390 Urinary tract infection, site not specified: Secondary | ICD-10-CM | POA: Diagnosis not present

## 2019-11-07 DIAGNOSIS — M25511 Pain in right shoulder: Secondary | ICD-10-CM | POA: Diagnosis not present

## 2019-11-08 DIAGNOSIS — F319 Bipolar disorder, unspecified: Secondary | ICD-10-CM | POA: Diagnosis not present

## 2019-11-20 DIAGNOSIS — R05 Cough: Secondary | ICD-10-CM | POA: Diagnosis not present

## 2019-11-20 DIAGNOSIS — R430 Anosmia: Secondary | ICD-10-CM | POA: Diagnosis not present

## 2019-11-20 DIAGNOSIS — Z1152 Encounter for screening for COVID-19: Secondary | ICD-10-CM | POA: Diagnosis not present

## 2019-12-08 DIAGNOSIS — Z5181 Encounter for therapeutic drug level monitoring: Secondary | ICD-10-CM | POA: Diagnosis not present

## 2019-12-08 DIAGNOSIS — Z1389 Encounter for screening for other disorder: Secondary | ICD-10-CM | POA: Diagnosis not present

## 2019-12-08 DIAGNOSIS — Z79899 Other long term (current) drug therapy: Secondary | ICD-10-CM | POA: Diagnosis not present

## 2019-12-08 DIAGNOSIS — J45909 Unspecified asthma, uncomplicated: Secondary | ICD-10-CM | POA: Diagnosis not present

## 2019-12-08 DIAGNOSIS — G43909 Migraine, unspecified, not intractable, without status migrainosus: Secondary | ICD-10-CM | POA: Diagnosis not present

## 2019-12-08 DIAGNOSIS — Z0000 Encounter for general adult medical examination without abnormal findings: Secondary | ICD-10-CM | POA: Diagnosis not present

## 2019-12-08 DIAGNOSIS — F3162 Bipolar disorder, current episode mixed, moderate: Secondary | ICD-10-CM | POA: Diagnosis not present

## 2019-12-08 DIAGNOSIS — F419 Anxiety disorder, unspecified: Secondary | ICD-10-CM | POA: Diagnosis not present

## 2019-12-08 DIAGNOSIS — E785 Hyperlipidemia, unspecified: Secondary | ICD-10-CM | POA: Diagnosis not present

## 2019-12-08 DIAGNOSIS — R300 Dysuria: Secondary | ICD-10-CM | POA: Diagnosis not present

## 2019-12-28 DIAGNOSIS — H9209 Otalgia, unspecified ear: Secondary | ICD-10-CM | POA: Diagnosis not present

## 2020-01-08 DIAGNOSIS — R319 Hematuria, unspecified: Secondary | ICD-10-CM | POA: Diagnosis not present

## 2020-01-08 DIAGNOSIS — Z79899 Other long term (current) drug therapy: Secondary | ICD-10-CM | POA: Diagnosis not present

## 2020-01-08 DIAGNOSIS — F419 Anxiety disorder, unspecified: Secondary | ICD-10-CM | POA: Diagnosis not present

## 2020-01-08 DIAGNOSIS — Z5181 Encounter for therapeutic drug level monitoring: Secondary | ICD-10-CM | POA: Diagnosis not present

## 2020-01-08 DIAGNOSIS — F909 Attention-deficit hyperactivity disorder, unspecified type: Secondary | ICD-10-CM | POA: Diagnosis not present

## 2020-01-08 DIAGNOSIS — R05 Cough: Secondary | ICD-10-CM | POA: Diagnosis not present

## 2020-01-10 DIAGNOSIS — J45901 Unspecified asthma with (acute) exacerbation: Secondary | ICD-10-CM | POA: Diagnosis not present

## 2020-01-29 DIAGNOSIS — R05 Cough: Secondary | ICD-10-CM | POA: Diagnosis not present

## 2020-01-29 DIAGNOSIS — J4541 Moderate persistent asthma with (acute) exacerbation: Secondary | ICD-10-CM | POA: Diagnosis not present

## 2020-02-06 DIAGNOSIS — F3163 Bipolar disorder, current episode mixed, severe, without psychotic features: Secondary | ICD-10-CM | POA: Diagnosis not present

## 2020-02-06 DIAGNOSIS — F419 Anxiety disorder, unspecified: Secondary | ICD-10-CM | POA: Diagnosis not present

## 2020-02-06 DIAGNOSIS — F909 Attention-deficit hyperactivity disorder, unspecified type: Secondary | ICD-10-CM | POA: Diagnosis not present

## 2020-02-09 DIAGNOSIS — J45901 Unspecified asthma with (acute) exacerbation: Secondary | ICD-10-CM | POA: Diagnosis not present

## 2020-02-12 DIAGNOSIS — F319 Bipolar disorder, unspecified: Secondary | ICD-10-CM | POA: Diagnosis not present

## 2020-03-05 DIAGNOSIS — F909 Attention-deficit hyperactivity disorder, unspecified type: Secondary | ICD-10-CM | POA: Diagnosis not present

## 2020-03-05 DIAGNOSIS — Z5181 Encounter for therapeutic drug level monitoring: Secondary | ICD-10-CM | POA: Diagnosis not present

## 2020-03-05 DIAGNOSIS — Z79899 Other long term (current) drug therapy: Secondary | ICD-10-CM | POA: Diagnosis not present

## 2020-03-05 DIAGNOSIS — F419 Anxiety disorder, unspecified: Secondary | ICD-10-CM | POA: Diagnosis not present

## 2020-03-11 DIAGNOSIS — J45901 Unspecified asthma with (acute) exacerbation: Secondary | ICD-10-CM | POA: Diagnosis not present

## 2020-04-02 DIAGNOSIS — R000 Tachycardia, unspecified: Secondary | ICD-10-CM | POA: Diagnosis not present

## 2020-04-02 DIAGNOSIS — R300 Dysuria: Secondary | ICD-10-CM | POA: Diagnosis not present

## 2020-04-02 DIAGNOSIS — Z5181 Encounter for therapeutic drug level monitoring: Secondary | ICD-10-CM | POA: Diagnosis not present

## 2020-04-02 DIAGNOSIS — Z79899 Other long term (current) drug therapy: Secondary | ICD-10-CM | POA: Diagnosis not present

## 2020-04-02 DIAGNOSIS — F419 Anxiety disorder, unspecified: Secondary | ICD-10-CM | POA: Diagnosis not present

## 2020-04-02 DIAGNOSIS — F909 Attention-deficit hyperactivity disorder, unspecified type: Secondary | ICD-10-CM | POA: Diagnosis not present

## 2020-04-11 DIAGNOSIS — J45901 Unspecified asthma with (acute) exacerbation: Secondary | ICD-10-CM | POA: Diagnosis not present

## 2020-04-15 DIAGNOSIS — M7501 Adhesive capsulitis of right shoulder: Secondary | ICD-10-CM | POA: Diagnosis not present

## 2020-05-03 DIAGNOSIS — Z5181 Encounter for therapeutic drug level monitoring: Secondary | ICD-10-CM | POA: Diagnosis not present

## 2020-05-03 DIAGNOSIS — Z79899 Other long term (current) drug therapy: Secondary | ICD-10-CM | POA: Diagnosis not present

## 2020-05-03 DIAGNOSIS — N200 Calculus of kidney: Secondary | ICD-10-CM | POA: Diagnosis not present

## 2020-05-03 DIAGNOSIS — G8929 Other chronic pain: Secondary | ICD-10-CM | POA: Diagnosis not present

## 2020-05-03 DIAGNOSIS — T148XXA Other injury of unspecified body region, initial encounter: Secondary | ICD-10-CM | POA: Diagnosis not present

## 2020-05-03 DIAGNOSIS — J302 Other seasonal allergic rhinitis: Secondary | ICD-10-CM | POA: Diagnosis not present

## 2020-05-03 DIAGNOSIS — M25511 Pain in right shoulder: Secondary | ICD-10-CM | POA: Diagnosis not present

## 2020-05-03 DIAGNOSIS — F909 Attention-deficit hyperactivity disorder, unspecified type: Secondary | ICD-10-CM | POA: Diagnosis not present

## 2020-05-03 DIAGNOSIS — R319 Hematuria, unspecified: Secondary | ICD-10-CM | POA: Diagnosis not present

## 2020-05-11 DIAGNOSIS — J45901 Unspecified asthma with (acute) exacerbation: Secondary | ICD-10-CM | POA: Diagnosis not present

## 2020-05-12 DIAGNOSIS — Z20822 Contact with and (suspected) exposure to covid-19: Secondary | ICD-10-CM | POA: Diagnosis not present

## 2020-05-13 DIAGNOSIS — F319 Bipolar disorder, unspecified: Secondary | ICD-10-CM | POA: Diagnosis not present

## 2020-06-03 DIAGNOSIS — Z5181 Encounter for therapeutic drug level monitoring: Secondary | ICD-10-CM | POA: Diagnosis not present

## 2020-06-03 DIAGNOSIS — F419 Anxiety disorder, unspecified: Secondary | ICD-10-CM | POA: Diagnosis not present

## 2020-06-03 DIAGNOSIS — R3911 Hesitancy of micturition: Secondary | ICD-10-CM | POA: Diagnosis not present

## 2020-06-03 DIAGNOSIS — Z79899 Other long term (current) drug therapy: Secondary | ICD-10-CM | POA: Diagnosis not present

## 2020-06-03 DIAGNOSIS — F909 Attention-deficit hyperactivity disorder, unspecified type: Secondary | ICD-10-CM | POA: Diagnosis not present

## 2020-06-07 DIAGNOSIS — R1310 Dysphagia, unspecified: Secondary | ICD-10-CM | POA: Diagnosis not present

## 2020-06-07 DIAGNOSIS — R3911 Hesitancy of micturition: Secondary | ICD-10-CM | POA: Diagnosis not present

## 2020-06-11 DIAGNOSIS — J45901 Unspecified asthma with (acute) exacerbation: Secondary | ICD-10-CM | POA: Diagnosis not present

## 2020-07-02 DIAGNOSIS — F410 Panic disorder [episodic paroxysmal anxiety] without agoraphobia: Secondary | ICD-10-CM | POA: Diagnosis not present

## 2020-07-02 DIAGNOSIS — N390 Urinary tract infection, site not specified: Secondary | ICD-10-CM | POA: Diagnosis not present

## 2020-07-02 DIAGNOSIS — F909 Attention-deficit hyperactivity disorder, unspecified type: Secondary | ICD-10-CM | POA: Diagnosis not present

## 2020-07-11 DIAGNOSIS — J45901 Unspecified asthma with (acute) exacerbation: Secondary | ICD-10-CM | POA: Diagnosis not present

## 2020-07-12 DIAGNOSIS — F332 Major depressive disorder, recurrent severe without psychotic features: Secondary | ICD-10-CM | POA: Diagnosis not present

## 2020-07-12 DIAGNOSIS — R519 Headache, unspecified: Secondary | ICD-10-CM | POA: Diagnosis not present

## 2020-07-12 DIAGNOSIS — F32A Depression, unspecified: Secondary | ICD-10-CM | POA: Diagnosis not present

## 2020-07-12 DIAGNOSIS — F4321 Adjustment disorder with depressed mood: Secondary | ICD-10-CM | POA: Diagnosis not present

## 2020-07-22 DIAGNOSIS — F332 Major depressive disorder, recurrent severe without psychotic features: Secondary | ICD-10-CM | POA: Diagnosis not present

## 2020-07-22 DIAGNOSIS — G43119 Migraine with aura, intractable, without status migrainosus: Secondary | ICD-10-CM | POA: Diagnosis not present

## 2020-07-22 DIAGNOSIS — F4321 Adjustment disorder with depressed mood: Secondary | ICD-10-CM | POA: Diagnosis not present

## 2020-07-30 DIAGNOSIS — G43909 Migraine, unspecified, not intractable, without status migrainosus: Secondary | ICD-10-CM | POA: Diagnosis not present

## 2020-07-30 DIAGNOSIS — G4700 Insomnia, unspecified: Secondary | ICD-10-CM | POA: Diagnosis not present

## 2020-07-30 DIAGNOSIS — Z79899 Other long term (current) drug therapy: Secondary | ICD-10-CM | POA: Diagnosis not present

## 2020-07-30 DIAGNOSIS — Z5181 Encounter for therapeutic drug level monitoring: Secondary | ICD-10-CM | POA: Diagnosis not present

## 2020-07-30 DIAGNOSIS — M62830 Muscle spasm of back: Secondary | ICD-10-CM | POA: Diagnosis not present

## 2020-07-30 DIAGNOSIS — F909 Attention-deficit hyperactivity disorder, unspecified type: Secondary | ICD-10-CM | POA: Diagnosis not present

## 2020-08-09 DIAGNOSIS — R300 Dysuria: Secondary | ICD-10-CM | POA: Diagnosis not present

## 2020-08-09 DIAGNOSIS — R059 Cough, unspecified: Secondary | ICD-10-CM | POA: Diagnosis not present

## 2020-08-09 DIAGNOSIS — M542 Cervicalgia: Secondary | ICD-10-CM | POA: Diagnosis not present

## 2020-08-09 DIAGNOSIS — M546 Pain in thoracic spine: Secondary | ICD-10-CM | POA: Diagnosis not present

## 2020-08-09 DIAGNOSIS — M4802 Spinal stenosis, cervical region: Secondary | ICD-10-CM | POA: Diagnosis not present

## 2020-08-09 DIAGNOSIS — R222 Localized swelling, mass and lump, trunk: Secondary | ICD-10-CM | POA: Diagnosis not present

## 2020-08-09 DIAGNOSIS — M50322 Other cervical disc degeneration at C5-C6 level: Secondary | ICD-10-CM | POA: Diagnosis not present

## 2020-08-09 DIAGNOSIS — M4602 Spinal enthesopathy, cervical region: Secondary | ICD-10-CM | POA: Diagnosis not present

## 2020-08-09 DIAGNOSIS — M545 Low back pain, unspecified: Secondary | ICD-10-CM | POA: Diagnosis not present

## 2020-08-11 DIAGNOSIS — J45901 Unspecified asthma with (acute) exacerbation: Secondary | ICD-10-CM | POA: Diagnosis not present

## 2020-08-12 DIAGNOSIS — F319 Bipolar disorder, unspecified: Secondary | ICD-10-CM | POA: Diagnosis not present

## 2020-08-27 DIAGNOSIS — R300 Dysuria: Secondary | ICD-10-CM | POA: Diagnosis not present

## 2020-08-27 DIAGNOSIS — Z5181 Encounter for therapeutic drug level monitoring: Secondary | ICD-10-CM | POA: Diagnosis not present

## 2020-08-27 DIAGNOSIS — Z79899 Other long term (current) drug therapy: Secondary | ICD-10-CM | POA: Diagnosis not present

## 2020-08-27 DIAGNOSIS — F909 Attention-deficit hyperactivity disorder, unspecified type: Secondary | ICD-10-CM | POA: Diagnosis not present

## 2020-08-27 DIAGNOSIS — M25561 Pain in right knee: Secondary | ICD-10-CM | POA: Diagnosis not present

## 2020-08-27 DIAGNOSIS — M542 Cervicalgia: Secondary | ICD-10-CM | POA: Diagnosis not present

## 2020-08-27 DIAGNOSIS — J45909 Unspecified asthma, uncomplicated: Secondary | ICD-10-CM | POA: Diagnosis not present

## 2020-09-10 DIAGNOSIS — R0781 Pleurodynia: Secondary | ICD-10-CM | POA: Diagnosis not present

## 2020-09-11 DIAGNOSIS — M25561 Pain in right knee: Secondary | ICD-10-CM | POA: Diagnosis not present

## 2020-09-11 DIAGNOSIS — J45901 Unspecified asthma with (acute) exacerbation: Secondary | ICD-10-CM | POA: Diagnosis not present

## 2020-09-13 DIAGNOSIS — N390 Urinary tract infection, site not specified: Secondary | ICD-10-CM | POA: Diagnosis not present

## 2020-09-16 DIAGNOSIS — N390 Urinary tract infection, site not specified: Secondary | ICD-10-CM | POA: Diagnosis not present

## 2020-09-18 DIAGNOSIS — N390 Urinary tract infection, site not specified: Secondary | ICD-10-CM | POA: Diagnosis not present

## 2020-09-19 DIAGNOSIS — N390 Urinary tract infection, site not specified: Secondary | ICD-10-CM | POA: Diagnosis not present

## 2020-09-20 DIAGNOSIS — N390 Urinary tract infection, site not specified: Secondary | ICD-10-CM | POA: Diagnosis not present

## 2020-09-24 DIAGNOSIS — N390 Urinary tract infection, site not specified: Secondary | ICD-10-CM | POA: Diagnosis not present

## 2020-09-25 DIAGNOSIS — N390 Urinary tract infection, site not specified: Secondary | ICD-10-CM | POA: Diagnosis not present

## 2020-10-02 DIAGNOSIS — J370 Chronic laryngitis: Secondary | ICD-10-CM | POA: Insufficient documentation

## 2020-10-02 DIAGNOSIS — J310 Chronic rhinitis: Secondary | ICD-10-CM | POA: Diagnosis not present

## 2020-10-02 DIAGNOSIS — L739 Follicular disorder, unspecified: Secondary | ICD-10-CM | POA: Diagnosis not present

## 2020-10-09 DIAGNOSIS — J45901 Unspecified asthma with (acute) exacerbation: Secondary | ICD-10-CM | POA: Diagnosis not present

## 2020-10-09 DIAGNOSIS — Z1231 Encounter for screening mammogram for malignant neoplasm of breast: Secondary | ICD-10-CM | POA: Diagnosis not present

## 2020-10-18 DIAGNOSIS — N390 Urinary tract infection, site not specified: Secondary | ICD-10-CM | POA: Diagnosis not present

## 2020-10-18 DIAGNOSIS — R600 Localized edema: Secondary | ICD-10-CM | POA: Diagnosis not present

## 2020-10-18 DIAGNOSIS — R319 Hematuria, unspecified: Secondary | ICD-10-CM | POA: Diagnosis not present

## 2020-10-25 DIAGNOSIS — J309 Allergic rhinitis, unspecified: Secondary | ICD-10-CM | POA: Diagnosis not present

## 2020-10-25 DIAGNOSIS — N390 Urinary tract infection, site not specified: Secondary | ICD-10-CM | POA: Diagnosis not present

## 2020-10-25 DIAGNOSIS — Z79899 Other long term (current) drug therapy: Secondary | ICD-10-CM | POA: Diagnosis not present

## 2020-10-25 DIAGNOSIS — F909 Attention-deficit hyperactivity disorder, unspecified type: Secondary | ICD-10-CM | POA: Diagnosis not present

## 2020-10-25 DIAGNOSIS — Z5181 Encounter for therapeutic drug level monitoring: Secondary | ICD-10-CM | POA: Diagnosis not present

## 2020-10-25 DIAGNOSIS — R319 Hematuria, unspecified: Secondary | ICD-10-CM | POA: Diagnosis not present

## 2020-11-11 DIAGNOSIS — F319 Bipolar disorder, unspecified: Secondary | ICD-10-CM | POA: Diagnosis not present

## 2020-11-25 DIAGNOSIS — Z79899 Other long term (current) drug therapy: Secondary | ICD-10-CM | POA: Diagnosis not present

## 2020-11-25 DIAGNOSIS — N390 Urinary tract infection, site not specified: Secondary | ICD-10-CM | POA: Diagnosis not present

## 2020-11-25 DIAGNOSIS — E782 Mixed hyperlipidemia: Secondary | ICD-10-CM | POA: Diagnosis not present

## 2020-11-25 DIAGNOSIS — G43909 Migraine, unspecified, not intractable, without status migrainosus: Secondary | ICD-10-CM | POA: Diagnosis not present

## 2020-11-25 DIAGNOSIS — F413 Other mixed anxiety disorders: Secondary | ICD-10-CM | POA: Diagnosis not present

## 2020-11-25 DIAGNOSIS — Z5181 Encounter for therapeutic drug level monitoring: Secondary | ICD-10-CM | POA: Diagnosis not present

## 2020-11-25 DIAGNOSIS — F909 Attention-deficit hyperactivity disorder, unspecified type: Secondary | ICD-10-CM | POA: Diagnosis not present

## 2020-12-30 DIAGNOSIS — S3210XA Unspecified fracture of sacrum, initial encounter for closed fracture: Secondary | ICD-10-CM | POA: Diagnosis not present

## 2020-12-30 DIAGNOSIS — M5459 Other low back pain: Secondary | ICD-10-CM | POA: Diagnosis not present

## 2020-12-30 DIAGNOSIS — S32120A Nondisplaced Zone II fracture of sacrum, initial encounter for closed fracture: Secondary | ICD-10-CM | POA: Diagnosis not present

## 2021-01-03 DIAGNOSIS — S3210XA Unspecified fracture of sacrum, initial encounter for closed fracture: Secondary | ICD-10-CM | POA: Diagnosis not present

## 2021-01-03 DIAGNOSIS — M545 Low back pain, unspecified: Secondary | ICD-10-CM | POA: Diagnosis not present

## 2021-01-23 DIAGNOSIS — J45909 Unspecified asthma, uncomplicated: Secondary | ICD-10-CM | POA: Diagnosis not present

## 2021-01-23 DIAGNOSIS — Z1331 Encounter for screening for depression: Secondary | ICD-10-CM | POA: Diagnosis not present

## 2021-01-23 DIAGNOSIS — Z681 Body mass index (BMI) 19 or less, adult: Secondary | ICD-10-CM | POA: Diagnosis not present

## 2021-01-23 DIAGNOSIS — Z79899 Other long term (current) drug therapy: Secondary | ICD-10-CM | POA: Diagnosis not present

## 2021-01-23 DIAGNOSIS — G43909 Migraine, unspecified, not intractable, without status migrainosus: Secondary | ICD-10-CM | POA: Diagnosis not present

## 2021-01-23 DIAGNOSIS — N390 Urinary tract infection, site not specified: Secondary | ICD-10-CM | POA: Diagnosis not present

## 2021-01-23 DIAGNOSIS — Z5181 Encounter for therapeutic drug level monitoring: Secondary | ICD-10-CM | POA: Diagnosis not present

## 2021-01-23 DIAGNOSIS — Z0000 Encounter for general adult medical examination without abnormal findings: Secondary | ICD-10-CM | POA: Diagnosis not present

## 2021-01-23 DIAGNOSIS — M549 Dorsalgia, unspecified: Secondary | ICD-10-CM | POA: Diagnosis not present

## 2021-02-08 DIAGNOSIS — J45901 Unspecified asthma with (acute) exacerbation: Secondary | ICD-10-CM | POA: Diagnosis not present

## 2021-02-10 DIAGNOSIS — F319 Bipolar disorder, unspecified: Secondary | ICD-10-CM | POA: Diagnosis not present

## 2021-02-18 DIAGNOSIS — F17210 Nicotine dependence, cigarettes, uncomplicated: Secondary | ICD-10-CM | POA: Diagnosis not present

## 2021-02-18 DIAGNOSIS — B9689 Other specified bacterial agents as the cause of diseases classified elsewhere: Secondary | ICD-10-CM | POA: Diagnosis not present

## 2021-02-18 DIAGNOSIS — N136 Pyonephrosis: Secondary | ICD-10-CM | POA: Diagnosis not present

## 2021-02-18 DIAGNOSIS — R1110 Vomiting, unspecified: Secondary | ICD-10-CM | POA: Diagnosis not present

## 2021-02-18 DIAGNOSIS — M1990 Unspecified osteoarthritis, unspecified site: Secondary | ICD-10-CM | POA: Diagnosis not present

## 2021-02-18 DIAGNOSIS — E860 Dehydration: Secondary | ICD-10-CM | POA: Diagnosis not present

## 2021-02-18 DIAGNOSIS — J45909 Unspecified asthma, uncomplicated: Secondary | ICD-10-CM | POA: Diagnosis not present

## 2021-02-18 DIAGNOSIS — Z79899 Other long term (current) drug therapy: Secondary | ICD-10-CM | POA: Diagnosis not present

## 2021-02-18 DIAGNOSIS — Z87442 Personal history of urinary calculi: Secondary | ICD-10-CM | POA: Diagnosis not present

## 2021-02-18 DIAGNOSIS — F319 Bipolar disorder, unspecified: Secondary | ICD-10-CM | POA: Diagnosis not present

## 2021-02-18 DIAGNOSIS — E7800 Pure hypercholesterolemia, unspecified: Secondary | ICD-10-CM | POA: Diagnosis not present

## 2021-02-18 DIAGNOSIS — Z792 Long term (current) use of antibiotics: Secondary | ICD-10-CM | POA: Diagnosis not present

## 2021-02-18 DIAGNOSIS — N200 Calculus of kidney: Secondary | ICD-10-CM | POA: Diagnosis not present

## 2021-02-18 DIAGNOSIS — E876 Hypokalemia: Secondary | ICD-10-CM | POA: Diagnosis not present

## 2021-02-18 DIAGNOSIS — N132 Hydronephrosis with renal and ureteral calculous obstruction: Secondary | ICD-10-CM | POA: Diagnosis not present

## 2021-02-18 DIAGNOSIS — Z883 Allergy status to other anti-infective agents status: Secondary | ICD-10-CM | POA: Diagnosis not present

## 2021-02-18 DIAGNOSIS — M1711 Unilateral primary osteoarthritis, right knee: Secondary | ICD-10-CM | POA: Diagnosis not present

## 2021-02-18 DIAGNOSIS — Z885 Allergy status to narcotic agent status: Secondary | ICD-10-CM | POA: Diagnosis not present

## 2021-02-18 DIAGNOSIS — F0390 Unspecified dementia without behavioral disturbance: Secondary | ICD-10-CM | POA: Diagnosis not present

## 2021-02-18 DIAGNOSIS — D649 Anemia, unspecified: Secondary | ICD-10-CM | POA: Diagnosis not present

## 2021-02-18 DIAGNOSIS — Z87440 Personal history of urinary (tract) infections: Secondary | ICD-10-CM | POA: Diagnosis not present

## 2021-02-18 DIAGNOSIS — N201 Calculus of ureter: Secondary | ICD-10-CM | POA: Diagnosis not present

## 2021-02-18 DIAGNOSIS — Z882 Allergy status to sulfonamides status: Secondary | ICD-10-CM | POA: Diagnosis not present

## 2021-02-18 DIAGNOSIS — N12 Tubulo-interstitial nephritis, not specified as acute or chronic: Secondary | ICD-10-CM | POA: Diagnosis not present

## 2021-02-18 DIAGNOSIS — F419 Anxiety disorder, unspecified: Secondary | ICD-10-CM | POA: Diagnosis not present

## 2021-02-20 DIAGNOSIS — N132 Hydronephrosis with renal and ureteral calculous obstruction: Secondary | ICD-10-CM | POA: Diagnosis not present

## 2021-02-20 DIAGNOSIS — F0390 Unspecified dementia without behavioral disturbance: Secondary | ICD-10-CM | POA: Diagnosis not present

## 2021-02-20 DIAGNOSIS — F319 Bipolar disorder, unspecified: Secondary | ICD-10-CM | POA: Diagnosis not present

## 2021-03-03 DIAGNOSIS — E876 Hypokalemia: Secondary | ICD-10-CM | POA: Diagnosis not present

## 2021-03-03 DIAGNOSIS — N200 Calculus of kidney: Secondary | ICD-10-CM | POA: Diagnosis not present

## 2021-03-04 DIAGNOSIS — K6389 Other specified diseases of intestine: Secondary | ICD-10-CM | POA: Diagnosis not present

## 2021-03-04 DIAGNOSIS — Z466 Encounter for fitting and adjustment of urinary device: Secondary | ICD-10-CM | POA: Diagnosis not present

## 2021-03-04 DIAGNOSIS — N200 Calculus of kidney: Secondary | ICD-10-CM | POA: Diagnosis not present

## 2021-03-04 DIAGNOSIS — N201 Calculus of ureter: Secondary | ICD-10-CM | POA: Diagnosis not present

## 2021-03-05 DIAGNOSIS — N200 Calculus of kidney: Secondary | ICD-10-CM | POA: Diagnosis not present

## 2021-03-05 DIAGNOSIS — R1032 Left lower quadrant pain: Secondary | ICD-10-CM | POA: Diagnosis not present

## 2021-03-11 DIAGNOSIS — J45901 Unspecified asthma with (acute) exacerbation: Secondary | ICD-10-CM | POA: Diagnosis not present

## 2021-03-19 DIAGNOSIS — R1032 Left lower quadrant pain: Secondary | ICD-10-CM | POA: Diagnosis not present

## 2021-03-19 DIAGNOSIS — N200 Calculus of kidney: Secondary | ICD-10-CM | POA: Diagnosis not present

## 2021-03-27 DIAGNOSIS — U071 COVID-19: Secondary | ICD-10-CM | POA: Diagnosis not present

## 2021-04-11 DIAGNOSIS — J45901 Unspecified asthma with (acute) exacerbation: Secondary | ICD-10-CM | POA: Diagnosis not present

## 2021-05-02 DIAGNOSIS — U099 Post covid-19 condition, unspecified: Secondary | ICD-10-CM | POA: Diagnosis not present

## 2021-05-02 DIAGNOSIS — R059 Cough, unspecified: Secondary | ICD-10-CM | POA: Diagnosis not present

## 2021-05-11 DIAGNOSIS — U071 COVID-19: Secondary | ICD-10-CM | POA: Diagnosis not present

## 2021-05-11 DIAGNOSIS — J45901 Unspecified asthma with (acute) exacerbation: Secondary | ICD-10-CM | POA: Diagnosis not present

## 2021-05-11 DIAGNOSIS — J029 Acute pharyngitis, unspecified: Secondary | ICD-10-CM | POA: Diagnosis not present

## 2021-05-27 DIAGNOSIS — G4700 Insomnia, unspecified: Secondary | ICD-10-CM | POA: Diagnosis not present

## 2021-05-27 DIAGNOSIS — U099 Post covid-19 condition, unspecified: Secondary | ICD-10-CM | POA: Diagnosis not present

## 2021-05-27 DIAGNOSIS — F909 Attention-deficit hyperactivity disorder, unspecified type: Secondary | ICD-10-CM | POA: Diagnosis not present

## 2021-05-27 DIAGNOSIS — F419 Anxiety disorder, unspecified: Secondary | ICD-10-CM | POA: Diagnosis not present

## 2021-05-27 DIAGNOSIS — R8290 Unspecified abnormal findings in urine: Secondary | ICD-10-CM | POA: Diagnosis not present

## 2021-06-05 DIAGNOSIS — F319 Bipolar disorder, unspecified: Secondary | ICD-10-CM | POA: Diagnosis not present

## 2021-06-11 DIAGNOSIS — J45901 Unspecified asthma with (acute) exacerbation: Secondary | ICD-10-CM | POA: Diagnosis not present

## 2021-06-24 DIAGNOSIS — R413 Other amnesia: Secondary | ICD-10-CM | POA: Diagnosis not present

## 2021-06-24 DIAGNOSIS — R8290 Unspecified abnormal findings in urine: Secondary | ICD-10-CM | POA: Diagnosis not present

## 2021-06-24 DIAGNOSIS — I959 Hypotension, unspecified: Secondary | ICD-10-CM | POA: Diagnosis not present

## 2021-06-24 DIAGNOSIS — Z23 Encounter for immunization: Secondary | ICD-10-CM | POA: Diagnosis not present

## 2021-07-03 DIAGNOSIS — R059 Cough, unspecified: Secondary | ICD-10-CM | POA: Diagnosis not present

## 2021-07-03 DIAGNOSIS — J40 Bronchitis, not specified as acute or chronic: Secondary | ICD-10-CM | POA: Diagnosis not present

## 2021-07-03 DIAGNOSIS — R079 Chest pain, unspecified: Secondary | ICD-10-CM | POA: Diagnosis not present

## 2021-07-03 DIAGNOSIS — R071 Chest pain on breathing: Secondary | ICD-10-CM | POA: Diagnosis not present

## 2021-07-11 DIAGNOSIS — J45901 Unspecified asthma with (acute) exacerbation: Secondary | ICD-10-CM | POA: Diagnosis not present

## 2021-07-14 DIAGNOSIS — J40 Bronchitis, not specified as acute or chronic: Secondary | ICD-10-CM | POA: Diagnosis not present

## 2021-07-14 DIAGNOSIS — F419 Anxiety disorder, unspecified: Secondary | ICD-10-CM | POA: Diagnosis not present

## 2021-07-23 DIAGNOSIS — U099 Post covid-19 condition, unspecified: Secondary | ICD-10-CM | POA: Diagnosis not present

## 2021-09-05 DIAGNOSIS — R8290 Unspecified abnormal findings in urine: Secondary | ICD-10-CM | POA: Diagnosis not present

## 2021-09-05 DIAGNOSIS — J069 Acute upper respiratory infection, unspecified: Secondary | ICD-10-CM | POA: Diagnosis not present

## 2021-09-05 DIAGNOSIS — R0789 Other chest pain: Secondary | ICD-10-CM | POA: Diagnosis not present

## 2021-09-15 DIAGNOSIS — G43109 Migraine with aura, not intractable, without status migrainosus: Secondary | ICD-10-CM | POA: Diagnosis not present

## 2021-09-15 DIAGNOSIS — E782 Mixed hyperlipidemia: Secondary | ICD-10-CM | POA: Diagnosis not present

## 2021-09-30 DIAGNOSIS — J45909 Unspecified asthma, uncomplicated: Secondary | ICD-10-CM | POA: Diagnosis not present

## 2021-10-31 DIAGNOSIS — J45909 Unspecified asthma, uncomplicated: Secondary | ICD-10-CM | POA: Diagnosis not present

## 2021-11-11 DIAGNOSIS — M549 Dorsalgia, unspecified: Secondary | ICD-10-CM | POA: Diagnosis not present

## 2021-11-11 DIAGNOSIS — R6889 Other general symptoms and signs: Secondary | ICD-10-CM | POA: Diagnosis not present

## 2021-11-11 DIAGNOSIS — R300 Dysuria: Secondary | ICD-10-CM | POA: Diagnosis not present

## 2021-11-11 DIAGNOSIS — G43109 Migraine with aura, not intractable, without status migrainosus: Secondary | ICD-10-CM | POA: Diagnosis not present

## 2021-11-11 DIAGNOSIS — M25511 Pain in right shoulder: Secondary | ICD-10-CM | POA: Diagnosis not present

## 2021-11-11 DIAGNOSIS — M25512 Pain in left shoulder: Secondary | ICD-10-CM | POA: Diagnosis not present

## 2021-11-11 DIAGNOSIS — R7989 Other specified abnormal findings of blood chemistry: Secondary | ICD-10-CM | POA: Diagnosis not present

## 2021-11-21 DIAGNOSIS — R1032 Left lower quadrant pain: Secondary | ICD-10-CM | POA: Diagnosis not present

## 2021-11-21 DIAGNOSIS — M6289 Other specified disorders of muscle: Secondary | ICD-10-CM | POA: Diagnosis not present

## 2021-11-21 DIAGNOSIS — R3915 Urgency of urination: Secondary | ICD-10-CM | POA: Diagnosis not present

## 2021-11-21 DIAGNOSIS — Z8619 Personal history of other infectious and parasitic diseases: Secondary | ICD-10-CM | POA: Diagnosis not present

## 2021-11-21 DIAGNOSIS — F17200 Nicotine dependence, unspecified, uncomplicated: Secondary | ICD-10-CM | POA: Insufficient documentation

## 2021-11-21 DIAGNOSIS — R82998 Other abnormal findings in urine: Secondary | ICD-10-CM | POA: Diagnosis not present

## 2021-11-21 DIAGNOSIS — R350 Frequency of micturition: Secondary | ICD-10-CM | POA: Diagnosis not present

## 2021-11-21 DIAGNOSIS — R1031 Right lower quadrant pain: Secondary | ICD-10-CM | POA: Diagnosis not present

## 2021-11-21 DIAGNOSIS — N390 Urinary tract infection, site not specified: Secondary | ICD-10-CM | POA: Diagnosis not present

## 2021-11-21 DIAGNOSIS — R109 Unspecified abdominal pain: Secondary | ICD-10-CM | POA: Diagnosis not present

## 2021-11-21 DIAGNOSIS — N200 Calculus of kidney: Secondary | ICD-10-CM | POA: Diagnosis not present

## 2021-11-21 DIAGNOSIS — U099 Post covid-19 condition, unspecified: Secondary | ICD-10-CM | POA: Diagnosis not present

## 2021-11-21 DIAGNOSIS — Z87440 Personal history of urinary (tract) infections: Secondary | ICD-10-CM | POA: Diagnosis not present

## 2021-12-02 DIAGNOSIS — M25511 Pain in right shoulder: Secondary | ICD-10-CM | POA: Diagnosis not present

## 2021-12-02 DIAGNOSIS — M7501 Adhesive capsulitis of right shoulder: Secondary | ICD-10-CM | POA: Diagnosis not present

## 2021-12-05 DIAGNOSIS — M25511 Pain in right shoulder: Secondary | ICD-10-CM | POA: Diagnosis not present

## 2021-12-08 DIAGNOSIS — H43813 Vitreous degeneration, bilateral: Secondary | ICD-10-CM | POA: Diagnosis not present

## 2021-12-12 DIAGNOSIS — H53422 Scotoma of blind spot area, left eye: Secondary | ICD-10-CM | POA: Diagnosis not present

## 2021-12-30 DIAGNOSIS — M7501 Adhesive capsulitis of right shoulder: Secondary | ICD-10-CM | POA: Diagnosis not present

## 2022-01-09 DIAGNOSIS — M62838 Other muscle spasm: Secondary | ICD-10-CM | POA: Diagnosis not present

## 2022-01-09 DIAGNOSIS — J069 Acute upper respiratory infection, unspecified: Secondary | ICD-10-CM | POA: Diagnosis not present

## 2022-01-09 DIAGNOSIS — M25511 Pain in right shoulder: Secondary | ICD-10-CM | POA: Diagnosis not present

## 2022-01-09 DIAGNOSIS — N390 Urinary tract infection, site not specified: Secondary | ICD-10-CM | POA: Diagnosis not present

## 2022-01-16 ENCOUNTER — Encounter (HOSPITAL_COMMUNITY): Payer: Self-pay | Admitting: Emergency Medicine

## 2022-01-16 ENCOUNTER — Emergency Department (HOSPITAL_COMMUNITY)
Admission: EM | Admit: 2022-01-16 | Discharge: 2022-01-16 | Discharge status: Against Medical Advice | Payer: Medicare Other | Attending: Emergency Medicine | Admitting: Emergency Medicine

## 2022-01-16 ENCOUNTER — Other Ambulatory Visit: Payer: Self-pay

## 2022-01-16 DIAGNOSIS — M791 Myalgia, unspecified site: Secondary | ICD-10-CM | POA: Insufficient documentation

## 2022-01-16 DIAGNOSIS — Z5321 Procedure and treatment not carried out due to patient leaving prior to being seen by health care provider: Secondary | ICD-10-CM | POA: Diagnosis not present

## 2022-01-16 DIAGNOSIS — R1011 Right upper quadrant pain: Secondary | ICD-10-CM | POA: Diagnosis not present

## 2022-01-16 LAB — URINALYSIS, ROUTINE W REFLEX MICROSCOPIC
Bilirubin Urine: NEGATIVE
Glucose, UA: NEGATIVE mg/dL
Ketones, ur: NEGATIVE mg/dL
Nitrite: NEGATIVE
Protein, ur: NEGATIVE mg/dL
Specific Gravity, Urine: 1.01 (ref 1.005–1.030)
pH: 5 (ref 5.0–8.0)

## 2022-01-16 LAB — COMPREHENSIVE METABOLIC PANEL
ALT: 34 U/L (ref 0–44)
AST: 31 U/L (ref 15–41)
Albumin: 4.6 g/dL (ref 3.5–5.0)
Alkaline Phosphatase: 68 U/L (ref 38–126)
Anion gap: 10 (ref 5–15)
BUN: 26 mg/dL — ABNORMAL HIGH (ref 6–20)
CO2: 22 mmol/L (ref 22–32)
Calcium: 9.5 mg/dL (ref 8.9–10.3)
Chloride: 105 mmol/L (ref 98–111)
Creatinine, Ser: 1.62 mg/dL — ABNORMAL HIGH (ref 0.44–1.00)
GFR, Estimated: 38 mL/min — ABNORMAL LOW (ref >60)
Glucose, Bld: 100 mg/dL — ABNORMAL HIGH (ref 70–99)
Potassium: 3.6 mmol/L (ref 3.5–5.1)
Sodium: 137 mmol/L (ref 135–145)
Total Bilirubin: 0.6 mg/dL (ref 0.3–1.2)
Total Protein: 7.5 g/dL (ref 6.5–8.1)

## 2022-01-16 LAB — CBC
HCT: 38.7 % (ref 36.0–46.0)
Hemoglobin: 12.5 g/dL (ref 12.0–15.0)
MCH: 30.3 pg (ref 26.0–34.0)
MCHC: 32.3 g/dL (ref 30.0–36.0)
MCV: 93.7 fL (ref 80.0–100.0)
Platelets: 337 10*3/uL (ref 150–400)
RBC: 4.13 MIL/uL (ref 3.87–5.11)
RDW: 12.8 % (ref 11.5–15.5)
WBC: 8.1 10*3/uL (ref 4.0–10.5)
nRBC: 0 % (ref 0.0–0.2)

## 2022-01-16 LAB — CK: Total CK: 760 U/L — ABNORMAL HIGH (ref 38–234)

## 2022-01-16 NOTE — ED Notes (Signed)
Pt left stickers with registration and decided to leave  

## 2022-01-16 NOTE — ED Triage Notes (Signed)
Patient complains of diffuse muscle spasms occurring all over her body that started several weeks ago but got worse approximately one week ago. Patient is alert, oriented, and in no apparent distress at this time.

## 2022-01-16 NOTE — ED Provider Triage Note (Signed)
Emergency Medicine Provider Triage Evaluation Note  Kara Weaver , a 51 y.o. female  was evaluated in triage.  Pt complains of generalized body aches worsening over the past 7 days.  Notes that she has had generalized body aches ongoing for longer than 7 days.  Denies fever, chills, nausea, vomiting.  Review of Systems  Positive: As per HPI above Negative:   Physical Exam  BP 119/79 (BP Location: Left Arm)   Pulse 99   Temp 97.7 F (36.5 C) (Oral)   Resp 18   SpO2 99%  Gen:   Awake, no distress   Resp:  Normal effort  MSK:   Moves extremities without difficulty  Other:    Medical Decision Making  Medically screening exam initiated at 6:43 PM.  Appropriate orders placed.  Kara Weaver was informed that the remainder of the evaluation will be completed by another provider, this initial triage assessment does not replace that evaluation, and the importance of remaining in the ED until their evaluation is complete.  Work-up initiated   Arika Mainer A, PA-C 01/16/22 1844

## 2022-01-21 DIAGNOSIS — E860 Dehydration: Secondary | ICD-10-CM | POA: Diagnosis not present

## 2022-01-21 DIAGNOSIS — I700 Atherosclerosis of aorta: Secondary | ICD-10-CM | POA: Diagnosis not present

## 2022-01-21 DIAGNOSIS — N390 Urinary tract infection, site not specified: Secondary | ICD-10-CM | POA: Diagnosis not present

## 2022-01-21 DIAGNOSIS — N200 Calculus of kidney: Secondary | ICD-10-CM | POA: Diagnosis not present

## 2022-01-21 DIAGNOSIS — R109 Unspecified abdominal pain: Secondary | ICD-10-CM | POA: Diagnosis not present

## 2022-01-21 DIAGNOSIS — R110 Nausea: Secondary | ICD-10-CM | POA: Diagnosis not present

## 2022-02-10 DIAGNOSIS — N390 Urinary tract infection, site not specified: Secondary | ICD-10-CM | POA: Diagnosis not present

## 2022-02-10 DIAGNOSIS — J069 Acute upper respiratory infection, unspecified: Secondary | ICD-10-CM | POA: Diagnosis not present

## 2022-03-06 DIAGNOSIS — F32A Depression, unspecified: Secondary | ICD-10-CM | POA: Diagnosis not present

## 2022-03-06 DIAGNOSIS — K219 Gastro-esophageal reflux disease without esophagitis: Secondary | ICD-10-CM | POA: Diagnosis not present

## 2022-03-06 DIAGNOSIS — N390 Urinary tract infection, site not specified: Secondary | ICD-10-CM | POA: Diagnosis not present

## 2022-03-06 DIAGNOSIS — Z6823 Body mass index (BMI) 23.0-23.9, adult: Secondary | ICD-10-CM | POA: Diagnosis not present

## 2022-03-06 DIAGNOSIS — E781 Pure hyperglyceridemia: Secondary | ICD-10-CM | POA: Diagnosis not present

## 2022-03-06 DIAGNOSIS — Z131 Encounter for screening for diabetes mellitus: Secondary | ICD-10-CM | POA: Diagnosis not present

## 2022-03-06 DIAGNOSIS — J45909 Unspecified asthma, uncomplicated: Secondary | ICD-10-CM | POA: Diagnosis not present

## 2022-03-06 DIAGNOSIS — G894 Chronic pain syndrome: Secondary | ICD-10-CM | POA: Diagnosis not present

## 2022-03-06 DIAGNOSIS — G43909 Migraine, unspecified, not intractable, without status migrainosus: Secondary | ICD-10-CM | POA: Diagnosis not present

## 2022-03-06 DIAGNOSIS — K5904 Chronic idiopathic constipation: Secondary | ICD-10-CM | POA: Diagnosis not present

## 2022-03-06 DIAGNOSIS — Z0000 Encounter for general adult medical examination without abnormal findings: Secondary | ICD-10-CM | POA: Diagnosis not present

## 2022-04-07 DIAGNOSIS — Z5181 Encounter for therapeutic drug level monitoring: Secondary | ICD-10-CM | POA: Diagnosis not present

## 2022-04-07 DIAGNOSIS — Z79899 Other long term (current) drug therapy: Secondary | ICD-10-CM | POA: Diagnosis not present

## 2022-04-10 DIAGNOSIS — H5340 Unspecified visual field defects: Secondary | ICD-10-CM | POA: Diagnosis not present

## 2022-05-29 DIAGNOSIS — R319 Hematuria, unspecified: Secondary | ICD-10-CM | POA: Diagnosis not present

## 2022-05-29 DIAGNOSIS — G8929 Other chronic pain: Secondary | ICD-10-CM | POA: Diagnosis not present

## 2022-05-29 DIAGNOSIS — M25511 Pain in right shoulder: Secondary | ICD-10-CM | POA: Diagnosis not present

## 2022-05-29 DIAGNOSIS — N200 Calculus of kidney: Secondary | ICD-10-CM | POA: Diagnosis not present

## 2022-05-29 DIAGNOSIS — N1831 Chronic kidney disease, stage 3a: Secondary | ICD-10-CM | POA: Diagnosis not present

## 2022-06-13 ENCOUNTER — Emergency Department (HOSPITAL_COMMUNITY): Payer: Medicare Other

## 2022-06-13 ENCOUNTER — Encounter (HOSPITAL_COMMUNITY): Payer: Self-pay | Admitting: Emergency Medicine

## 2022-06-13 ENCOUNTER — Emergency Department (HOSPITAL_COMMUNITY)
Admission: EM | Admit: 2022-06-13 | Discharge: 2022-06-13 | Discharge status: Against Medical Advice | Payer: Medicare Other | Attending: Emergency Medicine | Admitting: Emergency Medicine

## 2022-06-13 ENCOUNTER — Other Ambulatory Visit: Payer: Self-pay

## 2022-06-13 DIAGNOSIS — N183 Chronic kidney disease, stage 3 unspecified: Secondary | ICD-10-CM | POA: Insufficient documentation

## 2022-06-13 DIAGNOSIS — R1110 Vomiting, unspecified: Secondary | ICD-10-CM | POA: Diagnosis not present

## 2022-06-13 DIAGNOSIS — R109 Unspecified abdominal pain: Secondary | ICD-10-CM | POA: Diagnosis not present

## 2022-06-13 DIAGNOSIS — Z5321 Procedure and treatment not carried out due to patient leaving prior to being seen by health care provider: Secondary | ICD-10-CM | POA: Insufficient documentation

## 2022-06-13 DIAGNOSIS — N200 Calculus of kidney: Secondary | ICD-10-CM | POA: Diagnosis not present

## 2022-06-13 DIAGNOSIS — R1031 Right lower quadrant pain: Secondary | ICD-10-CM | POA: Insufficient documentation

## 2022-06-13 DIAGNOSIS — I700 Atherosclerosis of aorta: Secondary | ICD-10-CM | POA: Diagnosis not present

## 2022-06-13 DIAGNOSIS — Z9049 Acquired absence of other specified parts of digestive tract: Secondary | ICD-10-CM | POA: Diagnosis not present

## 2022-06-13 LAB — CBC WITH DIFFERENTIAL/PLATELET
Abs Immature Granulocytes: 0.04 10*3/uL (ref 0.00–0.07)
Basophils Absolute: 0.1 10*3/uL (ref 0.0–0.1)
Basophils Relative: 1 %
Eosinophils Absolute: 0.1 10*3/uL (ref 0.0–0.5)
Eosinophils Relative: 1 %
HCT: 40.2 % (ref 36.0–46.0)
Hemoglobin: 12.9 g/dL (ref 12.0–15.0)
Immature Granulocytes: 0 %
Lymphocytes Relative: 40 %
Lymphs Abs: 3.5 10*3/uL (ref 0.7–4.0)
MCH: 29.2 pg (ref 26.0–34.0)
MCHC: 32.1 g/dL (ref 30.0–36.0)
MCV: 91 fL (ref 80.0–100.0)
Monocytes Absolute: 0.8 10*3/uL (ref 0.1–1.0)
Monocytes Relative: 9 %
Neutro Abs: 4.5 10*3/uL (ref 1.7–7.7)
Neutrophils Relative %: 49 %
Platelets: 391 10*3/uL (ref 150–400)
RBC: 4.42 MIL/uL (ref 3.87–5.11)
RDW: 14.2 % (ref 11.5–15.5)
WBC: 8.9 10*3/uL (ref 4.0–10.5)
nRBC: 0 % (ref 0.0–0.2)

## 2022-06-13 LAB — COMPREHENSIVE METABOLIC PANEL
ALT: 11 U/L (ref 0–44)
AST: 16 U/L (ref 15–41)
Albumin: 4 g/dL (ref 3.5–5.0)
Alkaline Phosphatase: 60 U/L (ref 38–126)
Anion gap: 11 (ref 5–15)
BUN: 27 mg/dL — ABNORMAL HIGH (ref 6–20)
CO2: 17 mmol/L — ABNORMAL LOW (ref 22–32)
Calcium: 9.1 mg/dL (ref 8.9–10.3)
Chloride: 108 mmol/L (ref 98–111)
Creatinine, Ser: 2.07 mg/dL — ABNORMAL HIGH (ref 0.44–1.00)
GFR, Estimated: 28 mL/min — ABNORMAL LOW (ref >60)
Glucose, Bld: 95 mg/dL (ref 70–99)
Potassium: 2.5 mmol/L — CL (ref 3.5–5.1)
Sodium: 136 mmol/L (ref 135–145)
Total Bilirubin: 0.4 mg/dL (ref 0.3–1.2)
Total Protein: 7.2 g/dL (ref 6.5–8.1)

## 2022-06-13 LAB — URINALYSIS, ROUTINE W REFLEX MICROSCOPIC
Bilirubin Urine: NEGATIVE
Glucose, UA: NEGATIVE mg/dL
Hgb urine dipstick: NEGATIVE
Ketones, ur: NEGATIVE mg/dL
Nitrite: NEGATIVE
Protein, ur: 30 mg/dL — AB
Specific Gravity, Urine: 1.021 (ref 1.005–1.030)
WBC, UA: >50 WBC/hpf — ABNORMAL HIGH (ref 0–5)
pH: 5 (ref 5.0–8.0)

## 2022-06-13 NOTE — ED Triage Notes (Signed)
Pt c/o bilateral flank pain and lower back pain x 1 month. Pt states she has a kidney stone

## 2022-06-13 NOTE — ED Provider Triage Note (Signed)
Emergency Medicine Provider Triage Evaluation Note  Kara Weaver , a 51 y.o. female  was evaluated in triage.  Pt complains of right-sided flank pain and vomiting.  She states that she has a history of cholecystectomy and stage III chronic kidney disease.  States that she has had pain over the past month and a half in this area.  She seeks care this morning because she was having difficulty sleeping and increased vomiting.  No fevers.  Review of Systems  Positive: Flank pain and vomiting Negative: Fever  Physical Exam  BP 106/79 (BP Location: Left Arm)   Pulse (!) 114   Temp 98.4 F (36.9 C) (Oral)   Resp 18   Ht 5\' 8"  (1.727 m)   Wt 65.8 kg   SpO2 99%   BMI 22.05 kg/m  Gen:   Awake, no distress   Resp:  Normal effort  MSK:   Moves extremities without difficulty  Other:  Mild tachycardia  Medical Decision Making  Medically screening exam initiated at 6:37 AM.  Appropriate orders placed.  Kara Weaver was informed that the remainder of the evaluation will be completed by another provider, this initial triage assessment does not replace that evaluation, and the importance of remaining in the ED until their evaluation is complete.     , PA-C 06/13/22 [phone]

## 2022-06-17 ENCOUNTER — Telehealth: Payer: Self-pay

## 2022-06-17 NOTE — Telephone Encounter (Signed)
     Patient  visit on 11/11  at [place]   Have you been able to follow up with your primary care physician? Yes   The patient was or was not able to obtain any needed medicine or equipment. Yes   Are there diet recommendations that you are having difficulty following? Na   Patient expresses understanding of discharge instructions and education provided has no other needs at this time.  Yes      Ramiya Delahunty Pop Health Care Guide, [place], Care Management  [phone] [address], [place], [place] [address] Phone: [phone] Email: [email or web address]    

## 2022-06-30 DIAGNOSIS — M7541 Impingement syndrome of right shoulder: Secondary | ICD-10-CM | POA: Diagnosis not present

## 2022-06-30 DIAGNOSIS — M19011 Primary osteoarthritis, right shoulder: Secondary | ICD-10-CM | POA: Diagnosis not present

## 2022-07-06 DIAGNOSIS — Z1231 Encounter for screening mammogram for malignant neoplasm of breast: Secondary | ICD-10-CM | POA: Diagnosis not present

## 2022-07-07 DIAGNOSIS — M19011 Primary osteoarthritis, right shoulder: Secondary | ICD-10-CM | POA: Insufficient documentation

## 2022-07-07 DIAGNOSIS — M25811 Other specified joint disorders, right shoulder: Secondary | ICD-10-CM | POA: Insufficient documentation

## 2022-07-07 DIAGNOSIS — M75111 Incomplete rotator cuff tear or rupture of right shoulder, not specified as traumatic: Secondary | ICD-10-CM | POA: Insufficient documentation

## 2022-07-29 DIAGNOSIS — J988 Other specified respiratory disorders: Secondary | ICD-10-CM | POA: Diagnosis not present

## 2022-07-29 DIAGNOSIS — J4531 Mild persistent asthma with (acute) exacerbation: Secondary | ICD-10-CM | POA: Diagnosis not present

## 2022-08-04 ENCOUNTER — Other Ambulatory Visit: Payer: Self-pay

## 2022-08-04 MED ORDER — TOPIRAMATE 200 MG PO TABS: 200.0000 mg | ORAL_TABLET | Freq: Two times a day (BID) | ORAL | 1 refills | Status: DC

## 2022-08-04 MED ORDER — MONTELUKAST SODIUM 10 MG PO TABS: 10.0000 mg | ORAL_TABLET | Freq: Every day | ORAL | 0 refills | Status: DC

## 2022-08-05 ENCOUNTER — Encounter: Payer: Self-pay | Admitting: Internal Medicine

## 2022-08-05 ENCOUNTER — Other Ambulatory Visit: Payer: Self-pay

## 2022-08-05 ENCOUNTER — Ambulatory Visit: Payer: Medicare Other | Admitting: Internal Medicine

## 2022-08-05 VITALS — BP 128/74 | HR 123 | Temp 97.8°F | Resp 18 | Ht 68.0 in | Wt 152.5 lb

## 2022-08-05 DIAGNOSIS — F900 Attention-deficit hyperactivity disorder, predominantly inattentive type: Secondary | ICD-10-CM

## 2022-08-05 DIAGNOSIS — G43019 Migraine without aura, intractable, without status migrainosus: Secondary | ICD-10-CM

## 2022-08-05 DIAGNOSIS — N390 Urinary tract infection, site not specified: Secondary | ICD-10-CM

## 2022-08-05 LAB — POCT URINALYSIS DIPSTICK
Bilirubin, UA: NEGATIVE
Blood, UA: NEGATIVE
Glucose, UA: NEGATIVE
Ketones, UA: NEGATIVE
Nitrite, UA: NEGATIVE
Protein, UA: POSITIVE — AB
Spec Grav, UA: 1.02 (ref 1.010–1.025)
Urobilinogen, UA: 0.2 E.U./dL
pH, UA: 5.5 (ref 5.0–8.0)

## 2022-08-05 MED ORDER — ONDANSETRON HCL 8 MG PO TABS: ORAL_TABLET | ORAL | 0 refills | Status: DC

## 2022-08-05 MED ORDER — TOPIRAMATE 200 MG PO TABS: 200.0000 mg | ORAL_TABLET | Freq: Two times a day (BID) | ORAL | 1 refills | Status: DC

## 2022-08-05 MED ORDER — CYCLOBENZAPRINE HCL 5 MG PO TABS: 5.0000 mg | ORAL_TABLET | Freq: Three times a day (TID) | ORAL | 1 refills | Status: DC | PRN

## 2022-08-05 MED ORDER — AMPHETAMINE-DEXTROAMPHET ER 30 MG PO CP24: 60.0000 mg | ORAL_CAPSULE | Freq: Every day | ORAL | 0 refills | Status: DC

## 2022-08-05 NOTE — Progress Notes (Addendum)
Established Patient Office Visit  Subjective   Patient ID: Kara Weaver, female    DOB: [birth year]  Age: 52 y.o. MRN: [id number]  Chief Complaint  Patient presents with   Follow-up   ADHD   Chronic Kidney Disease   Pain   Urinary Tract Infection   History of present illness.  The patient is a 52 year old Caucasian/White female who presents with a history of ADHD, Bipolar disorder, anxiety, posttraumatic stress disorder who is here for follow-up.  She is complaining of dysuria and says that her symptoms are the same that when she has urinary tract infection.  Her urine analysis in our office today is negative for urinary tract infection.  She follows with psychiatrist for bipolar disorder but says that she get anxiety tablet from here.  He was given lorazepam but she has been getting clonazepam 2 mg from psychiatrist and last prescription was filled December 28. The symptoms have been present for years and have not changed significantly over time. They are described as severe and tend to remain fairly stable during the day. The patient also has a history of sleep difficulties. The symptoms are aggravated by lack of sleep and stressors at home. The condition is alleviated by medication. Her family history is relevant for anxiety disorders. Her social history is detailed in the Social History section. Current treatments include dextroamphetamine-amphetamine (Adderall).  He says that she needs refill of ADHD medicine.  She does not work and I am not sure how this medicine affect her life.  But she is on this medicine for her long time.        He also has a chronic kidney disease stage IIIb a/b and she was referred to see a nephrologist.  Her last blood draw done in our office shows GFR of 48. She also has migraine headache and her symptoms are controlled.     Review of Systems  Respiratory: Negative.    Cardiovascular: Negative.       Objective:     BP 128/74 (BP Location: Left Arm,  Patient Position: Sitting)   Pulse (!) 123   Temp 97.8 F (36.6 C)   Resp 18   Ht 5\' 8"  (1.727 m)   Wt 152 lb 8 oz (69.2 kg)   SpO2 97%   BMI 23.19 kg/m    Physical Exam HENT:     Head: Normocephalic and atraumatic.  Cardiovascular:     Rate and Rhythm: Normal rate and regular rhythm.     Heart sounds: Normal heart sounds.  Pulmonary:     Effort: Pulmonary effort is normal.     Breath sounds: Normal breath sounds.  Abdominal:     General: Bowel sounds are normal.     Palpations: Abdomen is soft.  Neurological:     General: No focal deficit present.     Mental Status: She is alert and oriented to person, place, and time.      Results for orders placed or performed in visit on 08/05/22  POCT urinalysis dipstick  Result Value Ref Range   Color, UA yellow    Clarity, UA clear    Glucose, UA Negative Negative   Bilirubin, UA negative    Ketones, UA negative    Spec Grav, UA 1.020 1.010 - 1.025   Blood, UA neg    pH, UA 5.5 5.0 - 8.0   Protein, UA Positive (A) Negative   Urobilinogen, UA 0.2 0.2 or 1.0 E.U./dL   Nitrite, UA  negative    Leukocytes, UA Small (1+) (A) Negative   Appearance     Odor        The ASCVD Risk score (Arnett DK, et al., 2019) failed to calculate for the following reasons:   Cannot find a previous HDL lab   Cannot find a previous total cholesterol lab    Assessment & Plan:   Problem List Items Addressed This Visit       Cardiovascular and Mediastinum   Common migraine with intractable migraine    Her migraine is controlled so we will continue with current treatment.  She will continue with Topamax.      Relevant Medications   topiramate (TOPAMAX) 200 MG tablet     Other   ADHD (attention deficit hyperactivity disorder), inattentive type    I will send refill of her Adderall.      Other Visit Diagnoses     Urinary tract infection without hematuria, site unspecified    -  Primary   Relevant Orders   POCT urinalysis dipstick  (Completed)   Urine Culture (Completed)       Return in about 3 months (around 11/04/2022).    Garwin Brothers, MD

## 2022-08-05 NOTE — Addendum Note (Signed)
Addended byGarwin Brothers on: 08/05/2022 12:40 PM   Modules accepted: Orders, Level of Service

## 2022-08-05 NOTE — Addendum Note (Signed)
Addended byLucile Shutters on: 08/05/2022 03:49 PM   Modules accepted: Orders

## 2022-08-05 NOTE — Addendum Note (Signed)
Addended byGarwin Brothers on: 08/05/2022 12:57 PM   Modules accepted: Orders

## 2022-08-06 DIAGNOSIS — F900 Attention-deficit hyperactivity disorder, predominantly inattentive type: Secondary | ICD-10-CM | POA: Insufficient documentation

## 2022-08-07 LAB — URINE CULTURE: Organism ID, Bacteria: NO GROWTH

## 2022-08-08 DIAGNOSIS — N390 Urinary tract infection, site not specified: Secondary | ICD-10-CM | POA: Insufficient documentation

## 2022-08-08 NOTE — Assessment & Plan Note (Signed)
Her migraine is controlled so we will continue with current treatment.  She will continue with Topamax.

## 2022-08-08 NOTE — Assessment & Plan Note (Signed)
Her urine analysis is questionable for urinary tract infection but I will send urine culture.

## 2022-08-08 NOTE — Assessment & Plan Note (Signed)
I will send refill of her Adderall.

## 2022-08-18 DIAGNOSIS — R053 Chronic cough: Secondary | ICD-10-CM | POA: Diagnosis not present

## 2022-08-18 DIAGNOSIS — J4489 Other specified chronic obstructive pulmonary disease: Secondary | ICD-10-CM | POA: Diagnosis not present

## 2022-08-18 DIAGNOSIS — R0602 Shortness of breath: Secondary | ICD-10-CM | POA: Diagnosis not present

## 2022-08-18 DIAGNOSIS — J449 Chronic obstructive pulmonary disease, unspecified: Secondary | ICD-10-CM | POA: Diagnosis not present

## 2022-08-18 DIAGNOSIS — J0100 Acute maxillary sinusitis, unspecified: Secondary | ICD-10-CM | POA: Diagnosis not present

## 2022-08-18 DIAGNOSIS — J00 Acute nasopharyngitis [common cold]: Secondary | ICD-10-CM | POA: Diagnosis not present

## 2022-08-24 ENCOUNTER — Other Ambulatory Visit: Payer: Self-pay

## 2022-08-24 MED ORDER — ATORVASTATIN CALCIUM 20 MG PO TABS: 20.0000 mg | ORAL_TABLET | Freq: Every day | ORAL | 2 refills | Status: DC

## 2022-09-01 ENCOUNTER — Other Ambulatory Visit: Payer: Self-pay | Admitting: Internal Medicine

## 2022-09-01 ENCOUNTER — Other Ambulatory Visit: Payer: Self-pay

## 2022-09-01 MED ORDER — OMEPRAZOLE 40 MG PO CPDR: 40.0000 mg | DELAYED_RELEASE_CAPSULE | Freq: Two times a day (BID) | ORAL | 0 refills | Status: DC

## 2022-09-04 ENCOUNTER — Other Ambulatory Visit: Payer: Self-pay | Admitting: Internal Medicine

## 2022-09-04 MED ORDER — AMPHETAMINE-DEXTROAMPHET ER 30 MG PO CP24: 60.0000 mg | ORAL_CAPSULE | Freq: Every day | ORAL | 0 refills | Status: DC

## 2022-09-07 ENCOUNTER — Other Ambulatory Visit: Payer: Self-pay

## 2022-09-07 MED ORDER — CYCLOBENZAPRINE HCL 5 MG PO TABS: 5.0000 mg | ORAL_TABLET | Freq: Three times a day (TID) | ORAL | 1 refills | Status: DC | PRN

## 2022-09-07 MED ORDER — GABAPENTIN 800 MG PO TABS: 800.0000 mg | ORAL_TABLET | Freq: Two times a day (BID) | ORAL | 3 refills | Status: DC

## 2022-09-07 MED ORDER — CYCLOBENZAPRINE HCL 10 MG PO TABS: 5.0000 mg | ORAL_TABLET | Freq: Three times a day (TID) | ORAL | 2 refills | Status: DC | PRN

## 2022-09-07 MED ORDER — GABAPENTIN 300 MG PO CAPS: ORAL_CAPSULE | ORAL | 3 refills | Status: DC

## 2022-09-08 ENCOUNTER — Other Ambulatory Visit: Payer: Self-pay | Admitting: Internal Medicine

## 2022-09-08 ENCOUNTER — Other Ambulatory Visit: Payer: Self-pay

## 2022-09-10 ENCOUNTER — Other Ambulatory Visit: Payer: Self-pay | Admitting: Internal Medicine

## 2022-09-11 ENCOUNTER — Other Ambulatory Visit: Payer: Self-pay | Admitting: Internal Medicine

## 2022-09-11 MED ORDER — LORAZEPAM 1 MG PO TABS: 1.0000 mg | ORAL_TABLET | Freq: Three times a day (TID) | ORAL | 0 refills | Status: DC | PRN

## 2022-10-02 DIAGNOSIS — M75111 Incomplete rotator cuff tear or rupture of right shoulder, not specified as traumatic: Secondary | ICD-10-CM | POA: Diagnosis not present

## 2022-10-02 DIAGNOSIS — M25811 Other specified joint disorders, right shoulder: Secondary | ICD-10-CM | POA: Diagnosis not present

## 2022-10-02 DIAGNOSIS — M19011 Primary osteoarthritis, right shoulder: Secondary | ICD-10-CM | POA: Diagnosis not present

## 2022-10-02 DIAGNOSIS — M25711 Osteophyte, right shoulder: Secondary | ICD-10-CM | POA: Diagnosis not present

## 2022-10-02 DIAGNOSIS — S43431A Superior glenoid labrum lesion of right shoulder, initial encounter: Secondary | ICD-10-CM | POA: Diagnosis not present

## 2022-10-02 DIAGNOSIS — M7541 Impingement syndrome of right shoulder: Secondary | ICD-10-CM | POA: Diagnosis not present

## 2022-10-02 DIAGNOSIS — G8918 Other acute postprocedural pain: Secondary | ICD-10-CM | POA: Diagnosis not present

## 2022-10-05 ENCOUNTER — Other Ambulatory Visit: Payer: Self-pay | Admitting: Internal Medicine

## 2022-10-05 MED ORDER — AMPHETAMINE-DEXTROAMPHET ER 30 MG PO CP24: 60.0000 mg | ORAL_CAPSULE | Freq: Every day | ORAL | 0 refills | Status: DC

## 2022-10-12 ENCOUNTER — Other Ambulatory Visit: Payer: Self-pay | Admitting: Internal Medicine

## 2022-10-12 MED ORDER — LORAZEPAM 1 MG PO TABS: 1.0000 mg | ORAL_TABLET | Freq: Three times a day (TID) | ORAL | 0 refills | Status: DC | PRN

## 2022-10-13 ENCOUNTER — Other Ambulatory Visit: Payer: Self-pay

## 2022-10-13 DIAGNOSIS — Z4789 Encounter for other orthopedic aftercare: Secondary | ICD-10-CM | POA: Diagnosis not present

## 2022-10-16 DIAGNOSIS — H53431 Sector or arcuate defects, right eye: Secondary | ICD-10-CM | POA: Diagnosis not present

## 2022-10-19 ENCOUNTER — Encounter: Payer: Self-pay | Admitting: Internal Medicine

## 2022-10-19 ENCOUNTER — Ambulatory Visit: Payer: 59 | Admitting: Internal Medicine

## 2022-10-19 VITALS — BP 124/78 | Temp 97.8°F | Resp 18 | Ht 68.0 in | Wt 162.0 lb

## 2022-10-19 DIAGNOSIS — R3121 Asymptomatic microscopic hematuria: Secondary | ICD-10-CM

## 2022-10-19 DIAGNOSIS — E785 Hyperlipidemia, unspecified: Secondary | ICD-10-CM | POA: Diagnosis not present

## 2022-10-19 DIAGNOSIS — N189 Chronic kidney disease, unspecified: Secondary | ICD-10-CM | POA: Insufficient documentation

## 2022-10-19 DIAGNOSIS — N184 Chronic kidney disease, stage 4 (severe): Secondary | ICD-10-CM

## 2022-10-19 DIAGNOSIS — R319 Hematuria, unspecified: Secondary | ICD-10-CM | POA: Insufficient documentation

## 2022-10-19 DIAGNOSIS — Z0000 Encounter for general adult medical examination without abnormal findings: Secondary | ICD-10-CM | POA: Insufficient documentation

## 2022-10-19 DIAGNOSIS — Z131 Encounter for screening for diabetes mellitus: Secondary | ICD-10-CM | POA: Diagnosis not present

## 2022-10-19 NOTE — Addendum Note (Signed)
Addended by: Precious Gilchrest on: 10/19/2022 04:07 PM   Modules accepted: Orders

## 2022-10-19 NOTE — Progress Notes (Signed)
Office Visit  Subjective   Patient ID: Kara Weaver   DOB: November 14, 1970   Age: 52 y.o.   MRN: [id number]   Chief Complaint Chief Complaint  Patient presents with   office visit    Check kidney function /blood sugar     History of Present Illness The patient is a 52 year old Caucasian/White female who presents with a history of ADHD, Bipolar disorder, anxiety, posttraumatic stress disorder who is here for follow-up. She follows with psychiatrist for bipolar disorder but says that she get anxiety tablet from here.  Her family history is relevant for anxiety disorders. Her social history is detailed in the Social History section. Current treatments include dextroamphetamine-amphetamine (Adderall).  He says that she needs refill of ADHD medicine.  She does not work and I am not sure how this medicine affect her life.  But she is on this medicine for her long time.         He also has a chronic kidney disease stage IIIb a/b and she was referred to see a nephrologist. But she has not seen them yet, she wanted to repeat labs aaand see where her renl function is. She says that she is feeling tired and feel like her kidney is infected again. Her last blood draw done in our office shows GFR of 48. She has noticed some blood in her urine. But no burning urination.  She also has migraine headache and her symptoms are controlled.     Past Medical History Past Medical History:  Diagnosis Date   Anxiety    Asthma    Common migraine with intractable migraine 03/08/2017   Depression    Migraine      Allergies Allergies  Allergen Reactions   Bactrim [Sulfamethoxazole-Trimethoprim]    Doxycycline    Levaquin [Levofloxacin]    Morphine And Related    Phenergan [Promethazine]      Review of Systems Review of Systems  Constitutional: Negative.   HENT: Negative.    Respiratory: Negative.    Cardiovascular: Negative.   Gastrointestinal: Negative.   Genitourinary:  Positive for hematuria.   Neurological: Negative.        Objective:    Vitals BP 124/78 (BP Location: Left Arm, Patient Position: Sitting, Cuff Size: Normal)   Temp 97.8 F (36.6 C)   Resp 18   Ht 5\' 8"  (1.727 m)   Wt 162 lb (73.5 kg)   BMI 24.63 kg/m    Physical Examination Physical Exam Constitutional:      Appearance: Normal appearance.  HENT:     Head: Normocephalic and atraumatic.  Eyes:     Extraocular Movements: Extraocular movements intact.     Pupils: Pupils are equal, round, and reactive to light.  Cardiovascular:     Rate and Rhythm: Normal rate and regular rhythm.     Heart sounds: Normal heart sounds.  Pulmonary:     Effort: Pulmonary effort is normal.     Breath sounds: Normal breath sounds.  Abdominal:     General: Bowel sounds are normal.     Palpations: Abdomen is soft.  Neurological:     General: No focal deficit present.     Mental Status: She is alert and oriented to person, place, and time.        Assessment & Plan:   CKD (chronic kidney disease) stage 4, GFR 15-29 ml/min (HCC) She has CKD stage IV  Hematuria I will repeat UA  Dyslipidemia She take atorvastatin 20 mg  daily.    Return in about 3 months (around 01/19/2023).   Garwin Brothers, MD

## 2022-10-19 NOTE — Assessment & Plan Note (Signed)
She take atorvastatin 20 mg daily.

## 2022-10-19 NOTE — Assessment & Plan Note (Signed)
I will repeat UA

## 2022-10-19 NOTE — Assessment & Plan Note (Signed)
She has CKD stage IV

## 2022-10-20 LAB — HEMOGLOBIN A1C
Est. average glucose Bld gHb Est-mCnc: 108 mg/dL
Hgb A1c MFr Bld: 5.4 % (ref 4.8–5.6)

## 2022-10-20 LAB — CMP14 + ANION GAP
ALT: 16 IU/L (ref 0–32)
AST: 18 IU/L (ref 0–40)
Albumin/Globulin Ratio: 1.8 (ref 1.2–2.2)
Albumin: 4.4 g/dL (ref 3.8–4.9)
Alkaline Phosphatase: 67 IU/L (ref 44–121)
Anion Gap: 20 mmol/L — ABNORMAL HIGH (ref 10.0–18.0)
BUN/Creatinine Ratio: 16 (ref 9–23)
BUN: 30 mg/dL — ABNORMAL HIGH (ref 6–24)
Bilirubin Total: <0.2 mg/dL (ref 0.0–1.2)
CO2: 19 mmol/L — ABNORMAL LOW (ref 20–29)
Calcium: 9.5 mg/dL (ref 8.7–10.2)
Chloride: 101 mmol/L (ref 96–106)
Creatinine, Ser: 1.84 mg/dL — ABNORMAL HIGH (ref 0.57–1.00)
Globulin, Total: 2.4 g/dL (ref 1.5–4.5)
Glucose: 96 mg/dL (ref 70–99)
Potassium: 4.5 mmol/L (ref 3.5–5.2)
Sodium: 140 mmol/L (ref 134–144)
Total Protein: 6.8 g/dL (ref 6.0–8.5)
eGFR: 33 mL/min/{1.73_m2} — ABNORMAL LOW (ref >59)

## 2022-10-20 LAB — MICROALBUMIN / CREATININE URINE RATIO
Creatinine, Urine: 44.3 mg/dL
Microalb/Creat Ratio: 30 mg/g creat — ABNORMAL HIGH (ref 0–29)
Microalbumin, Urine: 13.2 ug/mL

## 2022-10-26 ENCOUNTER — Other Ambulatory Visit: Payer: Self-pay | Admitting: Internal Medicine

## 2022-10-28 DIAGNOSIS — M25511 Pain in right shoulder: Secondary | ICD-10-CM | POA: Diagnosis not present

## 2022-10-28 DIAGNOSIS — M25611 Stiffness of right shoulder, not elsewhere classified: Secondary | ICD-10-CM | POA: Diagnosis not present

## 2022-10-31 ENCOUNTER — Other Ambulatory Visit: Payer: Self-pay | Admitting: Internal Medicine

## 2022-11-02 ENCOUNTER — Ambulatory Visit: Payer: 59 | Admitting: Internal Medicine

## 2022-11-02 DIAGNOSIS — M25611 Stiffness of right shoulder, not elsewhere classified: Secondary | ICD-10-CM | POA: Diagnosis not present

## 2022-11-02 DIAGNOSIS — M25511 Pain in right shoulder: Secondary | ICD-10-CM | POA: Diagnosis not present

## 2022-11-09 ENCOUNTER — Other Ambulatory Visit: Payer: Self-pay | Admitting: Internal Medicine

## 2022-11-09 DIAGNOSIS — M25611 Stiffness of right shoulder, not elsewhere classified: Secondary | ICD-10-CM | POA: Diagnosis not present

## 2022-11-09 DIAGNOSIS — M25511 Pain in right shoulder: Secondary | ICD-10-CM | POA: Diagnosis not present

## 2022-11-09 MED ORDER — AMPHETAMINE-DEXTROAMPHETAMINE 30 MG PO TABS: 60.0000 mg | ORAL_TABLET | Freq: Every day | ORAL | 0 refills | Status: DC

## 2022-11-09 MED ORDER — CYCLOBENZAPRINE HCL 10 MG PO TABS: 10.0000 mg | ORAL_TABLET | Freq: Three times a day (TID) | ORAL | 2 refills | Status: DC | PRN

## 2022-11-09 MED ORDER — AMPHETAMINE-DEXTROAMPHET ER 30 MG PO CP24: 60.0000 mg | ORAL_CAPSULE | Freq: Two times a day (BID) | ORAL | 0 refills | Status: DC

## 2022-11-10 DIAGNOSIS — M25561 Pain in right knee: Secondary | ICD-10-CM | POA: Diagnosis not present

## 2022-11-11 DIAGNOSIS — M25611 Stiffness of right shoulder, not elsewhere classified: Secondary | ICD-10-CM | POA: Diagnosis not present

## 2022-11-11 DIAGNOSIS — M25511 Pain in right shoulder: Secondary | ICD-10-CM | POA: Diagnosis not present

## 2022-11-17 DIAGNOSIS — M25611 Stiffness of right shoulder, not elsewhere classified: Secondary | ICD-10-CM | POA: Diagnosis not present

## 2022-11-17 DIAGNOSIS — M25511 Pain in right shoulder: Secondary | ICD-10-CM | POA: Diagnosis not present

## 2022-11-20 DIAGNOSIS — M25611 Stiffness of right shoulder, not elsewhere classified: Secondary | ICD-10-CM | POA: Diagnosis not present

## 2022-11-20 DIAGNOSIS — M25511 Pain in right shoulder: Secondary | ICD-10-CM | POA: Diagnosis not present

## 2022-11-22 ENCOUNTER — Other Ambulatory Visit: Payer: Self-pay | Admitting: Internal Medicine

## 2022-11-23 ENCOUNTER — Other Ambulatory Visit: Payer: Self-pay

## 2022-11-25 ENCOUNTER — Ambulatory Visit: Payer: 59 | Admitting: Internal Medicine

## 2022-11-25 ENCOUNTER — Encounter: Payer: Self-pay | Admitting: Internal Medicine

## 2022-11-25 VITALS — BP 122/78 | Temp 97.7°F | Resp 18 | Ht 68.0 in | Wt 157.1 lb

## 2022-11-25 DIAGNOSIS — G43019 Migraine without aura, intractable, without status migrainosus: Secondary | ICD-10-CM

## 2022-11-25 DIAGNOSIS — F3178 Bipolar disorder, in full remission, most recent episode mixed: Secondary | ICD-10-CM | POA: Diagnosis not present

## 2022-11-25 DIAGNOSIS — N184 Chronic kidney disease, stage 4 (severe): Secondary | ICD-10-CM | POA: Diagnosis not present

## 2022-11-25 DIAGNOSIS — F900 Attention-deficit hyperactivity disorder, predominantly inattentive type: Secondary | ICD-10-CM | POA: Diagnosis not present

## 2022-11-25 NOTE — Assessment & Plan Note (Signed)
She has not seen the kidney doctor. I will do labs today.

## 2022-11-26 ENCOUNTER — Other Ambulatory Visit: Payer: Self-pay | Admitting: Internal Medicine

## 2022-11-26 DIAGNOSIS — F319 Bipolar disorder, unspecified: Secondary | ICD-10-CM | POA: Insufficient documentation

## 2022-11-26 LAB — CMP14 + ANION GAP
ALT: 12 IU/L (ref 0–32)
AST: 13 IU/L (ref 0–40)
Albumin/Globulin Ratio: 2.3 — ABNORMAL HIGH (ref 1.2–2.2)
Albumin: 4.5 g/dL (ref 3.8–4.9)
Alkaline Phosphatase: 71 IU/L (ref 44–121)
Anion Gap: 16 mmol/L (ref 10.0–18.0)
BUN/Creatinine Ratio: 14 (ref 9–23)
BUN: 30 mg/dL — ABNORMAL HIGH (ref 6–24)
Bilirubin Total: <0.2 mg/dL (ref 0.0–1.2)
CO2: 18 mmol/L — ABNORMAL LOW (ref 20–29)
Calcium: 9.4 mg/dL (ref 8.7–10.2)
Chloride: 110 mmol/L — ABNORMAL HIGH (ref 96–106)
Creatinine, Ser: 2.22 mg/dL — ABNORMAL HIGH (ref 0.57–1.00)
Globulin, Total: 2 g/dL (ref 1.5–4.5)
Glucose: 97 mg/dL (ref 70–99)
Potassium: 3.9 mmol/L (ref 3.5–5.2)
Sodium: 144 mmol/L (ref 134–144)
Total Protein: 6.5 g/dL (ref 6.0–8.5)
eGFR: 26 mL/min/{1.73_m2} — ABNORMAL LOW (ref >59)

## 2022-11-26 LAB — MICROALBUMIN / CREATININE URINE RATIO
Creatinine, Urine: 34.5 mg/dL
Microalb/Creat Ratio: 44 mg/g creat — ABNORMAL HIGH (ref 0–29)
Microalbumin, Urine: 15.2 ug/mL

## 2022-11-26 MED ORDER — AMPHETAMINE-DEXTROAMPHET ER 30 MG PO CP24: 30.0000 mg | ORAL_CAPSULE | Freq: Every day | ORAL | 0 refills | Status: DC

## 2022-11-26 NOTE — Progress Notes (Signed)
   Office Visit  Subjective   Patient ID: Kara Weaver   DOB: 12/24/1970   Age: 52 y.o.   MRN: [id number]   Chief Complaint Chief Complaint  Patient presents with   Follow-up    Medication refills      History of Present Illness 52 years old female with history of bipolar disorder with hyperactivity and ADHD who is here to discuss refill of her ADHD medications.  She also take lorazepam 1 mg 3 times a day and take multiple medications for depression and 2 antipsychotic medication.  She says that she follows with [facility] and she cannot function without ADHD medication.  I have discussed with her that depression can also cause ADHD symptoms and also not comfortable with so many medication that she take.  She also has a CKD 3/4 and used to see a nephrologist but has not seen any nephrologist.  She says that she is not sure if she needed to go to see them or not. Also says that since she started taking Emgality injection her migraine is better.  Past Medical History Past Medical History:  Diagnosis Date   Anxiety    Asthma    Common migraine with intractable migraine 03/08/2017   Depression    Migraine      Allergies Allergies  Allergen Reactions   Bactrim [Sulfamethoxazole-Trimethoprim]    Doxycycline    Levaquin [Levofloxacin]    Morphine And Related    Phenergan [Promethazine]      Review of Systems Review of Systems  Constitutional: Negative.   Respiratory: Negative.    Cardiovascular: Negative.   Gastrointestinal: Negative.        Objective:    Vitals BP 122/78 (BP Location: Left Arm, Patient Position: Sitting, Cuff Size: Normal)   Temp 97.7 F (36.5 C)   Resp 18   Ht  (1.727 m)   Wt 157 lb 2 oz (71.3 kg)   BMI 23.89 kg/m    Physical Examination Physical Exam Constitutional:      Appearance: Normal appearance.  Cardiovascular:     Rate and Rhythm: Normal rate and regular rhythm.     Heart sounds: Normal heart sounds.  Pulmonary:     Effort:  Pulmonary effort is normal.     Breath sounds: Normal breath sounds.  Abdominal:     General: Bowel sounds are normal.     Palpations: Abdomen is soft.  Neurological:     General: No focal deficit present.     Mental Status: She is alert.        Assessment & Plan:   CKD (chronic kidney disease) stage 4, GFR 15-29 ml/min (HCC) She has not seen the kidney doctor. I will do labs today.  ADHD (attention deficit hyperactivity disorder), inattentive type I have discussed with her that she take Ativan 1 mg 3 times a day and getting ADHD medication as well.  She says that she actually take half tablets twice a day of lorazepam and that is only as needed.  I will send refill of her ADHD medication and also will discuss with [facility].  Bipolar disorder She follows with [facility] and take lorazepan 0.5 mg twice a day as PRN   Return in about 1 month (around 12/25/2022).   Eloisa Northern, MD

## 2022-11-26 NOTE — Assessment & Plan Note (Addendum)
I have discussed with her that she take Ativan 1 mg 3 times a day and getting ADHD medication as well.  She says that she actually take half tablets twice a day of lorazepam and that is only as needed.  I will send refill of her ADHD medication and also will discuss with [facility].

## 2022-11-26 NOTE — Assessment & Plan Note (Signed)
She follows with [facility] and take lorazepan 0.5 mg twice a day as PRN

## 2022-12-09 ENCOUNTER — Other Ambulatory Visit: Payer: Self-pay | Admitting: Internal Medicine

## 2022-12-09 MED ORDER — LORAZEPAM 0.5 MG PO TABS: 0.5000 mg | ORAL_TABLET | Freq: Three times a day (TID) | ORAL | 0 refills | Status: DC

## 2022-12-14 DIAGNOSIS — H25813 Combined forms of age-related cataract, bilateral: Secondary | ICD-10-CM | POA: Diagnosis not present

## 2022-12-21 ENCOUNTER — Other Ambulatory Visit: Payer: Self-pay

## 2022-12-21 DIAGNOSIS — N184 Chronic kidney disease, stage 4 (severe): Secondary | ICD-10-CM

## 2022-12-21 NOTE — Progress Notes (Signed)
Pt called in requesting referral to [facility]. Pt states that pt would be able to go to this location due to daughter living close to [facility]. Per Dr. Nelson Chimes we can send pt here and use last note and labs.

## 2022-12-23 ENCOUNTER — Ambulatory Visit: Payer: 59 | Admitting: Internal Medicine

## 2022-12-23 ENCOUNTER — Encounter: Payer: Self-pay | Admitting: Internal Medicine

## 2022-12-23 VITALS — BP 130/78 | Temp 97.7°F | Resp 18 | Ht 68.0 in | Wt 155.5 lb

## 2022-12-23 DIAGNOSIS — N184 Chronic kidney disease, stage 4 (severe): Secondary | ICD-10-CM

## 2022-12-23 DIAGNOSIS — N390 Urinary tract infection, site not specified: Secondary | ICD-10-CM | POA: Diagnosis not present

## 2022-12-23 LAB — POCT URINALYSIS DIPSTICK
Bilirubin, UA: NEGATIVE
Blood, UA: NEGATIVE
Glucose, UA: NEGATIVE
Ketones, UA: NEGATIVE
Nitrite, UA: NEGATIVE
Protein, UA: NEGATIVE
Spec Grav, UA: 1.015 (ref 1.010–1.025)
Urobilinogen, UA: 0.2 E.U./dL
pH, UA: 5.5 (ref 5.0–8.0)

## 2022-12-23 MED ORDER — CEPHALEXIN 500 MG PO CAPS: 500.0000 mg | ORAL_CAPSULE | Freq: Four times a day (QID) | ORAL | 0 refills | Status: AC

## 2022-12-23 NOTE — Progress Notes (Signed)
   Acute Office Visit  Subjective:     Patient ID: Kara Weaver, female    DOB: [birth year], 52 y.o.   MRN: [id number]  Chief Complaint  Patient presents with   Follow-up    1 month follow up     HPI Patient is in today for increase urinary frequency and sometime little dysuria. No fever but does has chills. Her UA shows UTI.   She also has CKD IV and I have referred referred her ot see nephrologist but she says her daughter lives in [place] and she wanted referral to [facility] so she can take her there.   Review of Systems  Constitutional: Negative.   Genitourinary:  Positive for frequency.        Objective:    BP 130/78 (BP Location: Left Arm, Patient Position: Sitting, Cuff Size: Normal)   Temp 97.7 F (36.5 C)   Resp 18   Ht 5\' 8"  (1.727 m)   Wt 155 lb 8 oz (70.5 kg)   BMI 23.64 kg/m    Physical Exam Constitutional:      Appearance: Normal appearance.  Abdominal:     General: Bowel sounds are normal.     Palpations: Abdomen is soft.  Neurological:     Mental Status: She is alert.     Results for orders placed or performed in visit on 12/23/22  POCT urinalysis dipstick  Result Value Ref Range   Color, UA Yellow    Clarity, UA Clear    Glucose, UA Negative Negative   Bilirubin, UA Negative    Ketones, UA Negative    Spec Grav, UA 1.015 1.010 - 1.025   Blood, UA Negative    pH, UA 5.5 5.0 - 8.0   Protein, UA Negative Negative   Urobilinogen, UA 0.2 0.2 or 1.0 E.U./dL   Nitrite, UA Negative    Leukocytes, UA Large (3+) (A) Negative   Appearance Clear    Odor          Assessment & Plan:   Problem List Items Addressed This Visit       Genitourinary   Urinary tract infection without hematuria - Primary    I will send urine culture so I will send cephalexin      Relevant Orders   POCT urinalysis dipstick (Completed)   CKD (chronic kidney disease) stage 4, GFR 15-29 ml/min (HCC)    Refer her to see nephrologist       No orders of the  defined types were placed in this encounter.   No follow-ups on file.  Eloisa Northern, MD

## 2022-12-23 NOTE — Assessment & Plan Note (Signed)
I will send urine culture so I will send cephalexin

## 2022-12-23 NOTE — Addendum Note (Signed)
Addended by: Dealva Lafoy on: 12/23/2022 12:00 PM   Modules accepted: Orders

## 2022-12-23 NOTE — Assessment & Plan Note (Signed)
Refer her to see nephrologist

## 2022-12-26 LAB — URINE CULTURE

## 2023-01-05 ENCOUNTER — Other Ambulatory Visit: Payer: Self-pay | Admitting: Internal Medicine

## 2023-01-06 ENCOUNTER — Other Ambulatory Visit: Payer: Self-pay | Admitting: Internal Medicine

## 2023-01-08 ENCOUNTER — Other Ambulatory Visit: Payer: Self-pay | Admitting: Internal Medicine

## 2023-01-13 ENCOUNTER — Ambulatory Visit: Payer: 59 | Admitting: Student

## 2023-01-13 ENCOUNTER — Encounter: Payer: Self-pay | Admitting: Student

## 2023-01-13 VITALS — BP 100/74 | HR 50 | Temp 98.1°F | Resp 20 | Ht 68.0 in | Wt 161.4 lb

## 2023-01-13 DIAGNOSIS — R8290 Unspecified abnormal findings in urine: Secondary | ICD-10-CM | POA: Diagnosis not present

## 2023-01-13 DIAGNOSIS — N184 Chronic kidney disease, stage 4 (severe): Secondary | ICD-10-CM

## 2023-01-13 DIAGNOSIS — N390 Urinary tract infection, site not specified: Secondary | ICD-10-CM

## 2023-01-13 LAB — POCT URINALYSIS DIPSTICK
Bilirubin, UA: NEGATIVE
Glucose, UA: NEGATIVE
Ketones, UA: NEGATIVE
Nitrite, UA: POSITIVE
Protein, UA: POSITIVE — AB
Spec Grav, UA: 1.015 (ref 1.010–1.025)
Urobilinogen, UA: 0.2 E.U./dL
pH, UA: 5.5 (ref 5.0–8.0)

## 2023-01-13 MED ORDER — ALBUTEROL SULFATE HFA 108 (90 BASE) MCG/ACT IN AERS: 1.0000 | INHALATION_SPRAY | RESPIRATORY_TRACT | 0 refills | Status: DC | PRN

## 2023-01-13 NOTE — Assessment & Plan Note (Signed)
We will collect blood work for renal function. She is still awaiting nephrology appointment. Our referral coordinator is aware.

## 2023-01-13 NOTE — Assessment & Plan Note (Signed)
A urinalysis and culture will be collected today. No new medications at this time.

## 2023-01-13 NOTE — Progress Notes (Signed)
Acute Office Visit  Subjective:     Patient ID: Kara Weaver, female    DOB: Jun 14, 1971, 52 y.o.   MRN: [id number]  Chief Complaint  Patient presents with   Follow-up    CKD Chronic UTI symptoms ADHD    Kara Weaver is a 52 years old female with history chronic kidney disease stage IIIb a/b and UTI. [name] states "my daughter wants me to get my kidney function and urine checked to make sure she clear from E. Coli. She reports that she is still waiting to see a nephrologist in [place]. Today she says that she is feeling tired and that her blood pressure readings at home have ranged from 79-81 systolic with diastolic range of 42-52. She denies loss of consciousness, or syncopal episodes. Her last blood draw done in our office shows GFR of 48. She denies burning, hematuria and frequency.      Review of Systems  Constitutional: Negative.   Respiratory: Negative.    Cardiovascular: Negative.  Negative for chest pain and palpitations.  Gastrointestinal: Negative.   Genitourinary:  Negative for dysuria, hematuria and urgency.  Neurological: Negative.   Psychiatric/Behavioral: Negative.          Objective:    BP 100/74 (BP Location: Left Arm, Patient Position: Sitting, Cuff Size: Normal)   Pulse (!) 50   Temp 98.1 F (36.7 C) (Temporal)   Resp 20   Ht 5\' 8"  (1.727 m)   Wt 161 lb 6.4 oz (73.2 kg)   SpO2 93% Comment: Pt has on long nails  BMI 24.54 kg/m    Physical Exam Constitutional:      General: She is not in acute distress. Cardiovascular:     Rate and Rhythm: Normal rate and regular rhythm.     Pulses: Normal pulses.     Heart sounds: Normal heart sounds.  Pulmonary:     Effort: Pulmonary effort is normal.     Breath sounds: Normal breath sounds.  Musculoskeletal:        General: Normal range of motion.     Right lower leg: No edema.     Left lower leg: No edema.  Skin:    General: Skin is warm and dry.     Capillary Refill: Capillary refill takes less  than 2 seconds.  Neurological:     Mental Status: She is alert and oriented to person, place, and time. Mental status is at baseline.  Psychiatric:        Mood and Affect: Mood normal.        Behavior: Behavior normal.     Results for orders placed or performed in visit on 01/13/23  POCT Urinalysis Dipstick (81002)  Result Value Ref Range   Color, UA Yellow    Clarity, UA Cloudy    Glucose, UA Negative Negative   Bilirubin, UA Negative    Ketones, UA Negative    Spec Grav, UA 1.015 1.010 - 1.025   Blood, UA Small    pH, UA 5.5 5.0 - 8.0   Protein, UA Positive (A) Negative   Urobilinogen, UA 0.2 0.2 or 1.0 E.U./dL   Nitrite, UA Positive    Leukocytes, UA Large (3+) (A) Negative   Appearance     Odor          Assessment & Plan:   Problem List Items Addressed This Visit     Urinary tract infection without hematuria   Relevant Orders   POCT Urinalysis Dipstick (40981) (Completed)  Urine Culture   CKD (chronic kidney disease) stage 4, GFR 15-29 ml/min (HCC) - Primary    We will collect blood work for renal function. She is still awaiting nephrology appointment. Our referral coordinator is aware.       Relevant Orders   CMP14 + Anion Gap   Abnormal finding on urinalysis    A urinalysis and culture will be collected today. No new medications at this time.       Meds ordered this encounter  Medications   albuterol (VENTOLIN HFA) 108 (90 Base) MCG/ACT inhaler    Sig: Inhale 1 puff into the lungs every 4 (four) hours as needed.    Dispense:  1 each    Refill:  0    No follow-ups on file.  Edwena Blow, NP

## 2023-01-14 ENCOUNTER — Other Ambulatory Visit: Payer: Self-pay

## 2023-01-14 LAB — CMP14 + ANION GAP
ALT: 12 IU/L (ref 0–32)
AST: 14 IU/L (ref 0–40)
Albumin/Globulin Ratio: 2.1
Albumin: 4.7 g/dL (ref 3.8–4.9)
Alkaline Phosphatase: 65 IU/L (ref 44–121)
Anion Gap: 21 mmol/L — ABNORMAL HIGH (ref 10.0–18.0)
BUN/Creatinine Ratio: 11 (ref 9–23)
BUN: 36 mg/dL — ABNORMAL HIGH (ref 6–24)
Bilirubin Total: <0.2 mg/dL (ref 0.0–1.2)
CO2: 16 mmol/L — ABNORMAL LOW (ref 20–29)
Calcium: 9.2 mg/dL (ref 8.7–10.2)
Chloride: 98 mmol/L (ref 96–106)
Creatinine, Ser: 3.38 mg/dL — ABNORMAL HIGH (ref 0.57–1.00)
Globulin, Total: 2.2 g/dL (ref 1.5–4.5)
Glucose: 93 mg/dL (ref 70–99)
Potassium: 4.2 mmol/L (ref 3.5–5.2)
Sodium: 135 mmol/L (ref 134–144)
Total Protein: 6.9 g/dL (ref 6.0–8.5)
eGFR: 16 mL/min/{1.73_m2} — ABNORMAL LOW (ref >59)

## 2023-01-14 MED ORDER — ALBUTEROL SULFATE HFA 108 (90 BASE) MCG/ACT IN AERS: 1.0000 | INHALATION_SPRAY | Freq: Four times a day (QID) | RESPIRATORY_TRACT | 2 refills | Status: AC | PRN

## 2023-01-14 MED ORDER — ALBUTEROL SULFATE HFA 108 (90 BASE) MCG/ACT IN AERS: 1.0000 | INHALATION_SPRAY | Freq: Four times a day (QID) | RESPIRATORY_TRACT | 2 refills | Status: DC | PRN

## 2023-01-16 LAB — URINE CULTURE

## 2023-01-18 ENCOUNTER — Telehealth: Payer: Self-pay

## 2023-01-18 ENCOUNTER — Ambulatory Visit: Payer: 59 | Admitting: Internal Medicine

## 2023-01-18 NOTE — Telephone Encounter (Signed)
-----   Message from Edwena Blow, NP sent at 01/15/2023  1:20 PM EDT ----- Please let her know that her labs show signs of dehydration. She should increase her oral fluid intake.  Since she has not seen nephrology in [place] yet. Dr. Nelson Chimes suggest that she get a referral to a nephrologist here.   Culture is still pending.

## 2023-01-18 NOTE — Telephone Encounter (Signed)
Pt called in about lab results. Pt notified. Pt also asked about referral. Spoke with [facility] and they stat receiving the referral and will send a message to the [facility] location to notify of pt needing an appt ASAP. Will notify pt once we receive urine culture results back.

## 2023-01-19 ENCOUNTER — Other Ambulatory Visit: Payer: Self-pay

## 2023-01-19 ENCOUNTER — Ambulatory Visit: Payer: 59 | Admitting: Student

## 2023-01-19 MED ORDER — NURTEC 75 MG PO TBDP: ORAL_TABLET | ORAL | 6 refills | Status: DC

## 2023-01-22 ENCOUNTER — Other Ambulatory Visit: Payer: Self-pay | Admitting: Internal Medicine

## 2023-01-22 ENCOUNTER — Telehealth: Payer: Self-pay

## 2023-01-22 MED ORDER — LORAZEPAM 0.5 MG PO TABS: 0.5000 mg | ORAL_TABLET | Freq: Three times a day (TID) | ORAL | 1 refills | Status: DC

## 2023-01-22 NOTE — Telephone Encounter (Signed)
Per Waylan Boga, we will refer to urology. Pt would like to a urology specialist in [place] if possible. Pt aware and notified.

## 2023-01-22 NOTE — Telephone Encounter (Signed)
-----   Message from Edwena Blow, NP sent at 01/18/2023  4:17 PM EDT ----- Please let her know that her culture is still showing E. Coli. At this time it is necessary to see urology for the next stages of treatment. We will place a referral to urology.

## 2023-01-25 ENCOUNTER — Other Ambulatory Visit: Payer: Self-pay

## 2023-01-25 MED ORDER — NURTEC 75 MG PO TBDP: ORAL_TABLET | ORAL | 6 refills | Status: DC

## 2023-01-26 DIAGNOSIS — R8281 Pyuria: Secondary | ICD-10-CM | POA: Diagnosis not present

## 2023-01-26 DIAGNOSIS — F17200 Nicotine dependence, unspecified, uncomplicated: Secondary | ICD-10-CM | POA: Diagnosis not present

## 2023-01-26 DIAGNOSIS — N179 Acute kidney failure, unspecified: Secondary | ICD-10-CM | POA: Diagnosis not present

## 2023-01-26 DIAGNOSIS — N184 Chronic kidney disease, stage 4 (severe): Secondary | ICD-10-CM | POA: Diagnosis not present

## 2023-01-29 ENCOUNTER — Other Ambulatory Visit: Payer: Self-pay | Admitting: Internal Medicine

## 2023-02-09 DIAGNOSIS — R053 Chronic cough: Secondary | ICD-10-CM | POA: Diagnosis not present

## 2023-02-09 DIAGNOSIS — R051 Acute cough: Secondary | ICD-10-CM | POA: Diagnosis not present

## 2023-02-09 DIAGNOSIS — J441 Chronic obstructive pulmonary disease with (acute) exacerbation: Secondary | ICD-10-CM | POA: Diagnosis not present

## 2023-02-16 DIAGNOSIS — R053 Chronic cough: Secondary | ICD-10-CM | POA: Diagnosis not present

## 2023-02-16 DIAGNOSIS — J441 Chronic obstructive pulmonary disease with (acute) exacerbation: Secondary | ICD-10-CM | POA: Diagnosis not present

## 2023-02-16 DIAGNOSIS — B370 Candidal stomatitis: Secondary | ICD-10-CM | POA: Diagnosis not present

## 2023-02-24 ENCOUNTER — Encounter: Payer: Self-pay | Admitting: Internal Medicine

## 2023-02-24 ENCOUNTER — Ambulatory Visit: Payer: 59 | Admitting: Internal Medicine

## 2023-02-24 VITALS — BP 118/70 | HR 98 | Temp 97.5°F | Resp 18 | Ht 68.0 in | Wt 163.4 lb

## 2023-02-24 DIAGNOSIS — J45901 Unspecified asthma with (acute) exacerbation: Secondary | ICD-10-CM | POA: Diagnosis not present

## 2023-02-24 DIAGNOSIS — R0789 Other chest pain: Secondary | ICD-10-CM | POA: Diagnosis not present

## 2023-02-24 NOTE — Addendum Note (Signed)
Addended byClerance Lav on: 02/24/2023 09:39 AM   Modules accepted: Orders

## 2023-02-24 NOTE — Assessment & Plan Note (Signed)
She still smoke, she is finishing steroid and has taken 2 course of antibiotic. I will change inhalor to trelegy and she will hold dulera. She will hold smoking. If she is not better then will do CT scan chest.

## 2023-02-24 NOTE — Assessment & Plan Note (Signed)
I will do EKG and will also do cardiac enzymes the will re-evaluate.

## 2023-02-24 NOTE — Addendum Note (Signed)
Addended byEloisa Northern on: 02/24/2023 09:06 AM   Modules accepted: Orders

## 2023-02-24 NOTE — Progress Notes (Signed)
   Acute Office Visit  Subjective:     Patient ID: Kara Weaver, female    DOB: [birth year], 52 y.o.   MRN: [id number]  Chief Complaint  Patient presents with   Cough    Office visit    Cough Associated symptoms include chest pain and shortness of breath.   Patient is in today for cough that is not getting any better for 1 month. She went to urgent care twice. She was given amoxicillin and then she was given z-pak but she still her symptoms are the same. She was given course of steroid that she is taking, she was also given cough syrup with codeine.  She denies any fever, she has chest pain last night and she woke up because of pain. It lasted 1/2 hour. Pain was tightness, she also felt it radiate to left arm. She does feel SOB. She has cxray both time at urgent care.   She has history of asthma.   Review of Systems  Constitutional: Negative.   HENT: Negative.    Respiratory:  Positive for cough and shortness of breath.   Cardiovascular:  Positive for chest pain.  Gastrointestinal: Negative.   Neurological: Negative.         Objective:    BP 118/70 (BP Location: Left Arm, Patient Position: Sitting, Cuff Size: Normal)   Pulse 98   Temp (!) 97.5 F (36.4 C)   Resp 18   Ht 5\' 8"  (1.727 m)   Wt 163 lb 6 oz (74.1 kg)   BMI 24.84 kg/m    Physical Exam Constitutional:      Appearance: Normal appearance.  HENT:     Head: Normocephalic and atraumatic.  Cardiovascular:     Rate and Rhythm: Normal rate and regular rhythm.     Pulses: Normal pulses.     Heart sounds: Normal heart sounds.  Pulmonary:     Breath sounds: Normal breath sounds.  Neurological:     General: No focal deficit present.     Mental Status: She is alert and oriented to person, place, and time.     No results found for any visits on 02/24/23.      Assessment & Plan:   Problem List Items Addressed This Visit       Respiratory   Moderate asthma with acute exacerbation - Primary    She  still smoke, she is finishing steroid and has taken 2 course of antibiotic. I will change inhalor to trelegy and she will hold dulera. She will hold smoking. If she is not better then will do CT scan chest.        Other   Other chest pain    I will do EKG and will also do cardiac enzymes the will re-evaluate.       No orders of the defined types were placed in this encounter.   No follow-ups on file.  Eloisa Northern, MD

## 2023-02-25 LAB — TROPONIN T: Troponin T (Highly Sensitive): 11 ng/L (ref 0–14)

## 2023-02-25 LAB — CK: Total CK: 113 U/L (ref 32–182)

## 2023-02-26 NOTE — Progress Notes (Signed)
Patient called.  Patient aware.  

## 2023-03-08 ENCOUNTER — Ambulatory Visit: Payer: 59 | Admitting: Internal Medicine

## 2023-03-16 ENCOUNTER — Ambulatory Visit: Payer: 59 | Admitting: Student

## 2023-03-16 ENCOUNTER — Encounter: Payer: Self-pay | Admitting: Student

## 2023-03-16 VITALS — BP 118/68 | HR 98 | Temp 97.8°F | Resp 16 | Ht 68.0 in | Wt 158.1 lb

## 2023-03-16 DIAGNOSIS — R8290 Unspecified abnormal findings in urine: Secondary | ICD-10-CM | POA: Diagnosis not present

## 2023-03-16 DIAGNOSIS — Z6824 Body mass index (BMI) 24.0-24.9, adult: Secondary | ICD-10-CM

## 2023-03-16 DIAGNOSIS — M19011 Primary osteoarthritis, right shoulder: Secondary | ICD-10-CM

## 2023-03-16 DIAGNOSIS — N189 Chronic kidney disease, unspecified: Secondary | ICD-10-CM | POA: Diagnosis not present

## 2023-03-16 DIAGNOSIS — E785 Hyperlipidemia, unspecified: Secondary | ICD-10-CM | POA: Diagnosis not present

## 2023-03-16 DIAGNOSIS — F17200 Nicotine dependence, unspecified, uncomplicated: Secondary | ICD-10-CM | POA: Diagnosis not present

## 2023-03-16 DIAGNOSIS — G43019 Migraine without aura, intractable, without status migrainosus: Secondary | ICD-10-CM

## 2023-03-16 DIAGNOSIS — Z0000 Encounter for general adult medical examination without abnormal findings: Secondary | ICD-10-CM | POA: Diagnosis not present

## 2023-03-16 DIAGNOSIS — Z23 Encounter for immunization: Secondary | ICD-10-CM | POA: Diagnosis not present

## 2023-03-16 DIAGNOSIS — F419 Anxiety disorder, unspecified: Secondary | ICD-10-CM | POA: Insufficient documentation

## 2023-03-16 MED ORDER — NURTEC 75 MG PO TBDP
ORAL_TABLET | ORAL | 6 refills | Status: DC
Start: 2023-03-16 — End: 2023-09-29

## 2023-03-16 MED ORDER — EMGALITY 120 MG/ML ~~LOC~~ SOAJ
1.0000 mL | SUBCUTANEOUS | 3 refills | Status: DC
Start: 2023-03-16 — End: 2023-07-23

## 2023-03-16 MED ORDER — GABAPENTIN 800 MG PO TABS
800.0000 mg | ORAL_TABLET | Freq: Two times a day (BID) | ORAL | 3 refills | Status: DC
Start: 2023-03-16 — End: 2023-05-12

## 2023-03-16 MED ORDER — CYCLOBENZAPRINE HCL 10 MG PO TABS
10.0000 mg | ORAL_TABLET | Freq: Three times a day (TID) | ORAL | 2 refills | Status: DC
Start: 2023-03-16 — End: 2023-06-28

## 2023-03-16 NOTE — Assessment & Plan Note (Addendum)
We have discussed smoking cessation today.  According to guidelines a low-dose CT of the chest has been ordered.

## 2023-03-16 NOTE — Assessment & Plan Note (Signed)
A full panel of laboratory studies have been collected today.  Tdap was updated as well as screening for hepatitis C and HIV.  Her last cervical screening 2023.

## 2023-03-16 NOTE — Progress Notes (Signed)
Patient: Kara Weaver, Female    DOB: 1970-12-31, 52 y.o.   MRN: [id number] Kara Blow, NP Visit Date: 03/16/2023   Chief Complaint  Patient presents with   Annual Exam    Annual exam and c/o: persistent, congested cough x 2 months   Subjective:   Annual physical exam:  Kara Weaver is a 52 y.o. female who presents today for complete physical exam. This patient's past medical history Anxiety, Asthma, Bipolar Disorder, Cholelithiasis, Chronic UTI's, Stage 4 chronic kidney disease, Migraine, Panic Disorder, Post Tramatic Stress Syndrome, and Renal Calculi.   Exercise/Activity: walks and does chair exercise  Diet/nutrition: does not eat out- appetite waxes and wain Sleep: some nights she does not sleep, taking trazodone, hydroxyzine, and mirtazapine .  Last Eye Exam: pt does  wear glasses 12/2022 Dental Exam: dentures    SDOH Screenings   Depression (PHQ2-9): High Risk (03/16/2023)  Tobacco Use: High Risk (02/24/2023)     USPSTF grade A and B recommendations - reviewed and addressed today  Depression:  Phq 9 completed today by patient, was reviewed by me with patient in the room PHQ score is 15, pt feels survivors guilt     03/16/2023    8:42 AM 11/19/2017   12:34 PM  PHQ 2/9 Scores  PHQ - 2 Score 3 0  PHQ- 9 Score 15       03/16/2023    8:42 AM 11/19/2017   12:34 PM  Depression screen PHQ 2/9  Decreased Interest 2 0  Down, Depressed, Hopeless 1 --  PHQ - 2 Score 3 0  Altered sleeping 2   Tired, decreased energy 1   Change in appetite 1   Feeling bad or failure about yourself  2   Trouble concentrating 3   Moving slowly or fidgety/restless 3   Suicidal thoughts 0   PHQ-9 Score 15     Alcohol screening: not drinking    Immunizations and Health Maintenance: Health Maintenance  Topic Date Due   Medicare Annual Wellness (AWV)  Never done   HIV Screening  Never done   Hepatitis C Screening  Never done   Zoster Vaccines- Shingrix (1 of 2) Never done    Colonoscopy  Never done   COVID-19 Vaccine (2 - Janssen risk series) 12/29/2019   INFLUENZA VACCINE  03/04/2023   MAMMOGRAM  07/06/2024   PAP SMEAR-Modifier  05/29/2025   DTaP/Tdap/Td (2 - Td or Tdap) 03/15/2033   HPV VACCINES  Aged Out      Immunizations: Immunization History  Administered Date(s) Administered   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 COVID: only received 1 dose      Hep C Screening: screening today   STD testing and prevention (HIV/chl/gon/syphilis):  n/a  Intimate partner violence:n/a  Sexual History/Pain during Intercourse: Legally Separated  Menstrual History/LMP/Abnormal Bleeding: menopause  for 5 years   Incontinence Symptoms: makes urine and was taking Myrbetriq.   Breast cancer:  Last Mammogram: 07/07/2022 negative  BRCA gene screening: n/a  Cervical cancer screening: 05/29/2022 due 2028 Pt does not have family hx of cancers - breast, ovarian, uterine, colon  Osteoporosis:   Discussion on osteoporosis per age, including high calcium and vitamin D supplementation, weight bearing exercises Pt is not supplementing with daily calcium/Vit D. Bone scan/dexa will be ordered due to comorbidity  Roughly experienced menopause at age  47.   Skin cancer:  Hx of skin CA - none  Discussed atypical lesions   Colorectal cancer:   Colonoscopy is she denies history of colonoscopy but reports a positive stool sample at some point.  We have called [place] [facility] to see if they have the results from that positive stool sample but they were unable to locate it.  We will move forward with the guaiac study for screening. Discussed concerning signs and sx of CRC, pt denies   Lung cancer:   Low Dose CT Chest recommended if Age 52-80 years, 20 pack-year currently smoking OR have quit w/in 15years. Patient does qualify. Current  smoker of 38 years.   Social History   Tobacco Use   Smoking status: Every Day    Current packs/day: 0.50    Types: Cigarettes   Smokeless tobacco: Never  Vaping Use   Vaping status: Every Day   Substances: Nicotine, Flavoring, Nicotine-salt  Substance Use Topics   Alcohol use: No   Drug use: No     Social History       Social History   Socioeconomic History   Marital status: Legally Separated    Spouse name: Not on file   Number of children: 1   Years of education: 10   Highest education level: Not on file  Occupational History   Not on file  Tobacco Use   Smoking status: Every Day    Current packs/day: 0.50    Types: Cigarettes   Smokeless tobacco: Never  Vaping Use   Vaping status: Every Day   Substances: Nicotine, Flavoring, Nicotine-salt  Substance and Sexual Activity   Alcohol use: No   Drug use: No   Sexual activity: Not on file  Other Topics Concern   Not on file  Social History Narrative   Lives with parents   Caffeine use: 3 cans coke per day   Left handed    Social Determinants of Health   Financial Resource Strain: Not on file  Food Insecurity: Not on file  Transportation Needs: Not on file  Physical Activity: Not on file  Stress: Not on file  Social Connections: Not on file    Family History       No family history on file.  Patient Active Problem List   Diagnosis Date Noted   Anxiety    Moderate asthma with acute exacerbation 02/24/2023   Other chest pain 02/24/2023   Abnormal finding on urinalysis 01/13/2023   Chronic kidney disease 10/19/2022   Hematuria 10/19/2022   Dyslipidemia 10/19/2022   Encounter for annual health examination 10/19/2022   Urinary tract infection without hematuria 08/08/2022   ADHD (attention deficit hyperactivity disorder), inattentive type 08/06/2022   Arthritis of right acromioclavicular joint 07/07/2022   Incomplete tear of right rotator cuff 07/07/2022   Impingement of right shoulder 07/07/2022    High-tone pelvic floor dysfunction in female 11/21/2021   Smoker 11/21/2021   Chronic rhinitis 10/02/2020   Chronic laryngitis 10/02/2020   Hemiplegic migraine without status migrainosus, not intractable 07/03/2019   Bipolar 1 disorder (HCC) 06/22/2018   Common migraine with intractable migraine 03/08/2017    Past Surgical History:  Procedure Laterality Date   ARTHROSCOPIC REPAIR ACL     BREAST SURGERY Left    Benign mass removed   GALLBLADDER SURGERY  1993   Removed   KIDNEY SURGERY     Stent placed     Current Outpatient Medications:    albuterol (VENTOLIN HFA) 108 (90 Base) MCG/ACT  inhaler, Inhale 1 puff into the lungs every 6 (six) hours as needed., Disp: 8 g, Rfl: 2   atorvastatin (LIPITOR) 20 MG tablet, Take 1 tablet (20 mg total) by mouth daily., Disp: 90 tablet, Rfl: 2   benztropine (COGENTIN) 1 MG tablet, Take 1 mg by mouth 2 (two) times daily., Disp: , Rfl:    busPIRone (BUSPAR) 10 MG tablet, Take 10 mg by mouth 2 (two) times daily., Disp: , Rfl:    cyclobenzaprine (FLEXERIL) 10 MG tablet, Take 1 tablet (10 mg total) by mouth 3 (three) times daily., Disp: 90 tablet, Rfl: 2   diclofenac (VOLTAREN) 75 MG EC tablet, Take 75 mg by mouth 2 (two) times daily., Disp: , Rfl:    DULERA 100-5 MCG/ACT AERO, Inhale 2 puffs into the lungs in the morning and at bedtime., Disp: , Rfl:    EMGALITY 120 MG/ML SOAJ, Inject 1 mL into the skin every 30 (thirty) days., Disp: 1.12 mL, Rfl: 3   EPINEPHrine 0.3 mg/0.3 mL IJ SOAJ injection, Inject 0.3 mg into the muscle as needed., Disp: , Rfl:    gabapentin (NEURONTIN) 800 MG tablet, Take 1 tablet (800 mg total) by mouth 2 (two) times daily., Disp: 60 tablet, Rfl: 3   hydrOXYzine (ATARAX) 25 MG tablet, Take 25-50 mg by mouth 2 (two) times daily as needed., Disp: , Rfl:    ipratropium-albuterol (DUONEB) 0.5-2.5 (3) MG/3ML SOLN, Inhale 3 mLs into the lungs every 4 (four) hours as needed., Disp: , Rfl:    lamoTRIgine (LAMICTAL) 25 MG tablet, Take 50  mg by mouth daily., Disp: , Rfl:    levocetirizine (XYZAL) 5 MG tablet, Take 5 mg by mouth daily., Disp: , Rfl:    LORazepam (ATIVAN) 0.5 MG tablet, Take 1 tablet (0.5 mg total) by mouth every 8 (eight) hours., Disp: 90 tablet, Rfl: 1   mirtazapine (REMERON) 7.5 MG tablet, Take 7.5 mg by mouth at bedtime., Disp: , Rfl:    montelukast (SINGULAIR) 10 MG tablet, TAKE 1 TABLET(10 MG) BY MOUTH DAILY, Disp: 90 tablet, Rfl: 0   MOVANTIK 25 MG TABS tablet, Take 25 mg by mouth daily., Disp: , Rfl:    MYRBETRIQ 25 MG TB24 tablet, Take 25 mg by mouth daily., Disp: , Rfl:    NURTEC 75 MG TBDP, Dissolve one tablet by mouth on top of tongue allow to dissolve then swallow every other day, Disp: 16 tablet, Rfl: 6   omeprazole (PRILOSEC) 40 MG capsule, TAKE 1 CAPSULE(40 MG) BY MOUTH TWICE DAILY, Disp: 180 capsule, Rfl: 2   ondansetron (ZOFRAN) 8 MG tablet, TAKE 1 TABLET BY MOUTH EVERY 6 HOURS AS NEEDED, Disp: 20 tablet, Rfl: 0   PARoxetine (PAXIL) 20 MG tablet, Take 20 mg by mouth daily., Disp: , Rfl:    scopolamine (TRANSDERM-SCOP) 1 MG/3DAYS, Place 1 patch onto the skin every 3 (three) days., Disp: , Rfl:    topiramate (TOPAMAX) 200 MG tablet, TAKE 1 TABLET(200 MG) BY MOUTH TWICE DAILY, Disp: 90 tablet, Rfl: 1   traZODone (DESYREL) 50 MG tablet, Take 50 mg by mouth at bedtime., Disp: , Rfl:    VRAYLAR 4.5 MG CAPS, Take 1 capsule by mouth daily., Disp: , Rfl:   Allergies  Allergen Reactions   Bactrim [Sulfamethoxazole-Trimethoprim]    Doxycycline    Levaquin [Levofloxacin]    Morphine And Codeine    Phenergan [Promethazine]     Advanced Care Planning:  A voluntary discussion about advance care planning including the explanation and discussion of advance  directives.   Discussed health care proxy and Living will, and the patient was able to identify a health care proxy as Aleda Grana, daughter Patient does not have a living will at present time.    Review of Systems Review of Systems   Constitutional: Negative.   HENT: Negative.    Eyes: Negative.   Respiratory:  Positive for cough. Negative for shortness of breath and wheezing.   Cardiovascular: Negative.   Gastrointestinal: Negative.   Genitourinary: Negative.   Skin: Negative.   Neurological: Negative.   Psychiatric/Behavioral:  Positive for depression.           Objective:   Vitals:   BP 118/68 (BP Location: Left Arm, Patient Position: Sitting, Cuff Size: Normal) Comment (BP Location): left arm  Pulse 98   Temp 97.8 F (36.6 C)   Resp 16   Ht 5\' 8"  (1.727 m)   Wt 158 lb 2 oz (71.7 kg)   SpO2 99%   BMI 24.04 kg/m    Physical Examination Physical Exam Vitals reviewed.  Constitutional:      Appearance: Normal appearance.  HENT:     Head: Normocephalic and atraumatic.     Right Ear: Tympanic membrane, ear canal and external ear normal.     Left Ear: Tympanic membrane, ear canal and external ear normal.     Nose: Nose normal. No congestion.     Mouth/Throat:     Mouth: Mucous membranes are moist.     Pharynx: Oropharynx is clear. No oropharyngeal exudate.  Eyes:     Extraocular Movements: Extraocular movements intact.     Conjunctiva/sclera: Conjunctivae normal.     Pupils: Pupils are equal, round, and reactive to light.  Cardiovascular:     Rate and Rhythm: Normal rate and regular rhythm.     Pulses: Normal pulses.     Heart sounds: Normal heart sounds.  Pulmonary:     Effort: Pulmonary effort is normal.     Breath sounds: Decreased air movement present. Examination of the right-upper field reveals decreased breath sounds. Examination of the left-upper field reveals decreased breath sounds. Decreased breath sounds present. No wheezing, rhonchi or rales.  Abdominal:     General: Bowel sounds are normal. There is no distension.     Palpations: Abdomen is soft.     Tenderness: There is no abdominal tenderness.  Musculoskeletal:        General: Normal range of motion.     Cervical back:  Normal range of motion. No rigidity.  Lymphadenopathy:     Cervical: No cervical adenopathy.  Skin:    General: Skin is warm.     Capillary Refill: Capillary refill takes less than 2 seconds.  Neurological:     Mental Status: She is alert and oriented to person, place, and time.  Psychiatric:        Mood and Affect: Mood normal.       Assessment & Plan:     Common migraine with intractable migraine Current migraine treatment has been effective.  I am refilling Emgality and Nurtec today.  Arthritis of right acromioclavicular joint Refills sent in for Flexeril and gabapentin.  I have placed the order for DEXA scan for evaluation of bone density.  Smoker We have discussed smoking cessation today.  According to guidelines a low-dose CT of the chest has been ordered.  Encounter for annual health examination A full panel of laboratory studies have been collected today.  Tdap was updated as well as screening for hepatitis  C and HIV.  Her last cervical screening 2023.     Melissaann was seen today for annual exam.  Diagnoses and all orders for this visit:  Encounter for annual health examination -     MM DIGITAL SCREENING BILATERAL; Future -     Lipid Profile -     Hemoglobin A1c -     CMP14+BNP -     CBC with Differential/Platelet -     Hepatitis C antibody -     POCT occult blood stool -     HIV Antibody (routine testing w rflx) -     TSH -     T3, free -     DG Bone Density; Future  Common migraine with intractable migraine -     EMGALITY 120 MG/ML SOAJ; Inject 1 mL into the skin every 30 (thirty) days. -     NURTEC 75 MG TBDP; Dissolve one tablet by mouth on top of tongue allow to dissolve then swallow every other day  Smoker -     CT CHEST LUNG CA SCREEN LOW DOSE W/O CM; Future  Arthritis of right acromioclavicular joint -     cyclobenzaprine (FLEXERIL) 10 MG tablet; Take 1 tablet (10 mg total) by mouth 3 (three) times daily. -     gabapentin (NEURONTIN) 800 MG  tablet; Take 1 tablet (800 mg total) by mouth 2 (two) times daily.  Other orders -     Tdap vaccine greater than or equal to 7yo IM             Kara Blow, NP 03/16/23 1:09 PM  Patient Care Team: Kara Blow, NP as PCP - General (Internal Medicine) Leonia Reader, Barbara Cower, MD as Referring Physician (Internal Medicine)

## 2023-03-16 NOTE — Assessment & Plan Note (Signed)
Current migraine treatment has been effective.  I am refilling Emgality and Nurtec today.

## 2023-03-16 NOTE — Assessment & Plan Note (Addendum)
Refills sent in for Flexeril and gabapentin.  I have placed the order for DEXA scan for evaluation of bone density.

## 2023-03-16 NOTE — Assessment & Plan Note (Deleted)
She was taking lorazepam 1 mg last dosed in 10/2022 decreased to 0.5 mg she is requesting to go back to 1 mg since she has stopped Adderall.  I informed her of the dependency associated with lorazepam.  Therefore I will be keeping her at the same dosage.  She would like to go back on Adderall and I am fine with this.

## 2023-03-17 ENCOUNTER — Encounter: Payer: 59 | Admitting: Internal Medicine

## 2023-03-17 DIAGNOSIS — N390 Urinary tract infection, site not specified: Secondary | ICD-10-CM | POA: Diagnosis not present

## 2023-03-17 LAB — CBC WITH DIFFERENTIAL/PLATELET
Basophils Absolute: 0.1 10*3/uL (ref 0.0–0.2)
Basos: 2 %
EOS (ABSOLUTE): 0.1 10*3/uL (ref 0.0–0.4)
Eos: 2 %
Hematocrit: 38.8 % (ref 34.0–46.6)
Hemoglobin: 12.7 g/dL (ref 11.1–15.9)
Immature Grans (Abs): 0 10*3/uL (ref 0.0–0.1)
Immature Granulocytes: 1 %
Lymphocytes Absolute: 2.4 10*3/uL (ref 0.7–3.1)
Lymphs: 40 %
MCH: 29.4 pg (ref 26.6–33.0)
MCHC: 32.7 g/dL (ref 31.5–35.7)
MCV: 90 fL (ref 79–97)
Monocytes Absolute: 0.5 10*3/uL (ref 0.1–0.9)
Monocytes: 9 %
Neutrophils Absolute: 2.9 10*3/uL (ref 1.4–7.0)
Neutrophils: 46 %
Platelets: 364 10*3/uL (ref 150–450)
RBC: 4.32 x10E6/uL (ref 3.77–5.28)
RDW: 12.4 % (ref 11.7–15.4)
WBC: 6.2 10*3/uL (ref 3.4–10.8)

## 2023-03-17 LAB — CMP14+BNP
ALT: 10 IU/L (ref 0–32)
AST: 11 IU/L (ref 0–40)
Albumin: 4.6 g/dL (ref 3.8–4.9)
Alkaline Phosphatase: 90 IU/L (ref 44–121)
BNP: <2.5 pg/mL (ref 0.0–100.0)
BUN/Creatinine Ratio: 8 — ABNORMAL LOW (ref 9–23)
BUN: 22 mg/dL (ref 6–24)
Bilirubin Total: <0.2 mg/dL (ref 0.0–1.2)
CO2: 16 mmol/L — ABNORMAL LOW (ref 20–29)
Calcium: 9.4 mg/dL (ref 8.7–10.2)
Chloride: 105 mmol/L (ref 96–106)
Creatinine, Ser: 2.77 mg/dL — ABNORMAL HIGH (ref 0.57–1.00)
Globulin, Total: 2 g/dL (ref 1.5–4.5)
Glucose: 77 mg/dL (ref 70–99)
Potassium: 3.7 mmol/L (ref 3.5–5.2)
Sodium: 138 mmol/L (ref 134–144)
Total Protein: 6.6 g/dL (ref 6.0–8.5)
eGFR: 20 mL/min/{1.73_m2} — ABNORMAL LOW (ref >59)

## 2023-03-17 LAB — HEPATITIS C ANTIBODY: Hep C Virus Ab: NONREACTIVE

## 2023-03-17 LAB — LIPID PANEL
Chol/HDL Ratio: 9.7 ratio — ABNORMAL HIGH (ref 0.0–4.4)
Cholesterol, Total: 330 mg/dL — ABNORMAL HIGH (ref 100–199)
HDL: 34 mg/dL — ABNORMAL LOW (ref >39)
LDL Chol Calc (NIH): 218 mg/dL — ABNORMAL HIGH (ref 0–99)
Triglycerides: 371 mg/dL — ABNORMAL HIGH (ref 0–149)
VLDL Cholesterol Cal: 78 mg/dL — ABNORMAL HIGH (ref 5–40)

## 2023-03-17 LAB — T3, FREE: T3, Free: 4.2 pg/mL (ref 2.0–4.4)

## 2023-03-17 LAB — TSH: TSH: 2.93 u[IU]/mL (ref 0.450–4.500)

## 2023-03-17 LAB — HEMOGLOBIN A1C
Est. average glucose Bld gHb Est-mCnc: 114 mg/dL
Hgb A1c MFr Bld: 5.6 % (ref 4.8–5.6)

## 2023-03-17 LAB — HIV ANTIBODY (ROUTINE TESTING W REFLEX): HIV Screen 4th Generation wRfx: NONREACTIVE

## 2023-03-18 NOTE — Progress Notes (Signed)
I have reviewed your lab results. Your cholesterol is significantly elevated, this put you at risk for heart attack and stroke so I believe is necessary for you to be on a cholesterol-lowering medication.  It looks like you used to be on atorvastatin, are you taking this? If not you should.  Try to avoid fried foods and fatty foods (red meats, pork products, whole milk products, and egg yolks) and try to eat more fresh fruits and vegetables.  Your kidney function is at baseline.

## 2023-03-23 ENCOUNTER — Other Ambulatory Visit: Payer: Self-pay | Admitting: Student

## 2023-03-23 MED ORDER — LORAZEPAM 0.5 MG PO TABS: 0.5000 mg | ORAL_TABLET | Freq: Three times a day (TID) | ORAL | 1 refills | Status: DC

## 2023-03-25 DIAGNOSIS — F17210 Nicotine dependence, cigarettes, uncomplicated: Secondary | ICD-10-CM | POA: Diagnosis not present

## 2023-03-25 DIAGNOSIS — Z122 Encounter for screening for malignant neoplasm of respiratory organs: Secondary | ICD-10-CM | POA: Diagnosis not present

## 2023-03-25 DIAGNOSIS — J439 Emphysema, unspecified: Secondary | ICD-10-CM | POA: Diagnosis not present

## 2023-03-31 ENCOUNTER — Other Ambulatory Visit: Payer: Self-pay | Admitting: Student

## 2023-03-31 MED ORDER — ONDANSETRON HCL 4 MG PO TABS: 4.0000 mg | ORAL_TABLET | Freq: Three times a day (TID) | ORAL | 0 refills | Status: DC | PRN

## 2023-03-31 NOTE — Progress Notes (Signed)
The patient was prescribed fosfomycin (MONUROL) 3 gram pack by urology and now complains of nausea without vomiting. I encouraged the patient to communicate her symptoms and or concerns with prescriber. I have sent in Ondansetron 4 mg to use every 8 hours prn. The patient verbalized understanding.

## 2023-04-01 DIAGNOSIS — A498 Other bacterial infections of unspecified site: Secondary | ICD-10-CM | POA: Diagnosis not present

## 2023-04-01 DIAGNOSIS — N3090 Cystitis, unspecified without hematuria: Secondary | ICD-10-CM | POA: Diagnosis not present

## 2023-04-01 DIAGNOSIS — N1832 Chronic kidney disease, stage 3b: Secondary | ICD-10-CM | POA: Diagnosis not present

## 2023-04-01 DIAGNOSIS — F17210 Nicotine dependence, cigarettes, uncomplicated: Secondary | ICD-10-CM | POA: Diagnosis not present

## 2023-04-02 ENCOUNTER — Other Ambulatory Visit: Payer: Self-pay | Admitting: Student

## 2023-04-02 NOTE — Progress Notes (Unsigned)
Results from CT lung without contrast: CT of lungs were benign for nodules. She should continue with annual screenings without contrast every 12 months.   Kara Weaver Kara Weaver

## 2023-04-07 ENCOUNTER — Encounter: Payer: Self-pay | Admitting: Student

## 2023-04-19 ENCOUNTER — Other Ambulatory Visit: Payer: Self-pay | Admitting: Internal Medicine

## 2023-04-21 ENCOUNTER — Telehealth: Payer: Self-pay

## 2023-04-21 ENCOUNTER — Other Ambulatory Visit: Payer: Self-pay | Admitting: Student

## 2023-04-21 MED ORDER — MOVANTIK 25 MG PO TABS: 25.0000 mg | ORAL_TABLET | Freq: Every day | ORAL | 0 refills | Status: DC

## 2023-04-21 NOTE — Telephone Encounter (Signed)
Requested Prescriptions   Signed Prescriptions Disp Refills   MOVANTIK 25 MG TABS tablet 30 tablet 0    Sig: Take 1 tablet (25 mg total) by mouth daily.    Authorizing Provider: Edwena Blow

## 2023-04-23 DIAGNOSIS — R112 Nausea with vomiting, unspecified: Secondary | ICD-10-CM | POA: Diagnosis not present

## 2023-04-23 DIAGNOSIS — G43019 Migraine without aura, intractable, without status migrainosus: Secondary | ICD-10-CM | POA: Diagnosis not present

## 2023-04-28 ENCOUNTER — Ambulatory Visit: Payer: 59 | Admitting: Student

## 2023-04-29 ENCOUNTER — Ambulatory Visit: Payer: 59 | Admitting: Student

## 2023-04-29 ENCOUNTER — Encounter: Payer: Self-pay | Admitting: Student

## 2023-04-29 VITALS — BP 122/64 | HR 95 | Temp 97.9°F | Resp 18 | Ht 68.0 in | Wt 159.5 lb

## 2023-04-29 DIAGNOSIS — G4483 Primary cough headache: Secondary | ICD-10-CM | POA: Insufficient documentation

## 2023-04-29 LAB — POC COVID19 BINAXNOW: SARS Coronavirus 2 Ag: NEGATIVE

## 2023-04-29 MED ORDER — BENZONATATE 100 MG PO CAPS: 100.0000 mg | ORAL_CAPSULE | Freq: Three times a day (TID) | ORAL | 0 refills | Status: DC | PRN

## 2023-04-29 MED ORDER — DM-GUAIFENESIN ER 30-600 MG PO TB12: 1.0000 | ORAL_TABLET | Freq: Two times a day (BID) | ORAL | 0 refills | Status: DC

## 2023-04-29 NOTE — Assessment & Plan Note (Addendum)
I believe this is of viral nature. She can use tessalon pearls for cough and mucinex to thin secretions; she should drink ample amounts of fluids. Supportive care with her current albuterol and nasal sprays.

## 2023-04-29 NOTE — Patient Instructions (Signed)
Use albuterol 2 puffs every 4 hours, if wheezing use the nebulizer.  Supportive care includes: hot tea, throat lozenges and salt/water gargles. Tylenol for fevers.

## 2023-04-29 NOTE — Progress Notes (Signed)
Established Patient Office Visit  Subjective   Patient ID: Kara Weaver, female    DOB: 1971-07-09  Age: 52 y.o. MRN: [id number]  Chief Complaint  Patient presents with   Cough    Pt. C/o: congested cough, spitting up green phlegm, vomiting, sore throat, had a headache one day. Last night couldn't breathe, had vaporizer on. X 2 days. O2 at home last night was below 90 when she checked.    Cough Associated symptoms include headaches.    Cough Patient complains of dyspnea, nasal congestion, productive cough, and productive cough with sputum described as green. Symptoms began on Monday increasing son Tuesday. Symptoms have been gradually worsening since that time.The cough is nocturnal. Associated symptoms include: chills, sputum production, weight loss, and wheezing. Patient does not have new pets. Patient does have a history of asthma. Patient does have a history of environmental allergens. Patient has not traveled recently. Patient does have a history of smoking. She has been using a humidifier, Robitussin, and nebulizer's.   Review of Systems  Constitutional: Negative.   HENT:  Positive for congestion.   Eyes: Negative.   Respiratory:  Positive for cough.   Cardiovascular: Negative.   Musculoskeletal: Negative.   Neurological:  Positive for headaches.      Objective:     BP 122/64   Pulse 95   Temp 97.9 F (36.6 C) (Temporal)   Resp 18   Ht 5\' 8"  (1.727 m)   Wt 159 lb 8 oz (72.3 kg)   SpO2 98%   BMI 24.25 kg/m    Physical Exam Vitals reviewed.  HENT:     Right Ear: Tympanic membrane, ear canal and external ear normal.     Left Ear: Tympanic membrane, ear canal and external ear normal.     Nose: Congestion present. No rhinorrhea.     Mouth/Throat:     Mouth: Mucous membranes are moist.     Pharynx: Posterior oropharyngeal erythema present. No oropharyngeal exudate.  Eyes:     Conjunctiva/sclera: Conjunctivae normal.  Cardiovascular:     Rate and Rhythm:  Normal rate and regular rhythm.     Pulses: Normal pulses.     Heart sounds: Normal heart sounds.  Pulmonary:     Effort: Pulmonary effort is normal.     Breath sounds: Normal breath sounds.  Abdominal:     General: Abdomen is flat.     Palpations: Abdomen is soft.  Musculoskeletal:     Cervical back: No tenderness.  Lymphadenopathy:     Cervical: No cervical adenopathy.  Skin:    General: Skin is warm.     Capillary Refill: Capillary refill takes less than 2 seconds.  Neurological:     Mental Status: She is alert and oriented to person, place, and time.      Results for orders placed or performed in visit on 04/29/23  POC COVID-19 BinaxNow  Result Value Ref Range   SARS Coronavirus 2 Ag Negative Negative      Assessment & Plan:   Problem List Items Addressed This Visit     Cough headache - Primary    I believe this is of viral nature. She can use tessalon pearls for cough and mucinex to thin secretions; she should drink ample amounts of fluids. Supportive care with her current albuterol and nasal sprays.       Relevant Orders   POC COVID-19 BinaxNow (Completed)    Return if symptoms worsen or fail to improve.  Edwena Blow, NP

## 2023-05-04 ENCOUNTER — Other Ambulatory Visit: Payer: Self-pay | Admitting: Student

## 2023-05-04 MED ORDER — AMPHETAMINE-DEXTROAMPHET ER 30 MG PO CP24: 30.0000 mg | ORAL_CAPSULE | Freq: Every day | ORAL | 0 refills | Status: DC

## 2023-05-04 NOTE — Progress Notes (Signed)
I have reviewed the documentation from [facility]. Faxed document from provider Barnett Applebaum, PMHMP who is agreeable to resuming patients Adderall XR30 mg capsules once daily. These documents have been placed in the patients file. I have reviewed the [place] Controlled Substance Registry. And the findings are congruent with patients managed care.   The patient will begin using Adderall XR 30 MG 24 hr once daily. Not to exceed 2 capsules a day.   Requested Prescriptions   Pending Prescriptions Disp Refills   amphetamine-dextroamphetamine (ADDERALL XR) 30 MG 24 hr capsule 30 capsule 1    Sig: Take 1 capsule (30 mg total) by mouth daily.

## 2023-05-12 ENCOUNTER — Other Ambulatory Visit: Payer: Self-pay | Admitting: Internal Medicine

## 2023-05-12 DIAGNOSIS — M19011 Primary osteoarthritis, right shoulder: Secondary | ICD-10-CM

## 2023-05-13 ENCOUNTER — Encounter: Payer: Self-pay | Admitting: Student

## 2023-05-13 ENCOUNTER — Other Ambulatory Visit: Payer: Self-pay | Admitting: Internal Medicine

## 2023-05-13 ENCOUNTER — Other Ambulatory Visit: Payer: Self-pay

## 2023-05-13 ENCOUNTER — Ambulatory Visit: Payer: 59 | Admitting: Student

## 2023-05-13 VITALS — BP 134/78 | HR 91 | Temp 97.6°F | Resp 20 | Ht 68.0 in | Wt 162.4 lb

## 2023-05-13 DIAGNOSIS — J069 Acute upper respiratory infection, unspecified: Secondary | ICD-10-CM | POA: Diagnosis not present

## 2023-05-13 MED ORDER — LORAZEPAM 0.5 MG PO TABS: 0.5000 mg | ORAL_TABLET | Freq: Three times a day (TID) | ORAL | 1 refills | Status: DC

## 2023-05-13 MED ORDER — AMOXICILLIN-POT CLAVULANATE 500-125 MG PO TABS
1.0000 | ORAL_TABLET | Freq: Three times a day (TID) | ORAL | 0 refills | Status: AC
Start: 2023-05-13 — End: 2023-05-18

## 2023-05-13 MED ORDER — IPRATROPIUM-ALBUTEROL 0.5-2.5 (3) MG/3ML IN SOLN
3.0000 mL | RESPIRATORY_TRACT | 2 refills | Status: DC | PRN
Start: 2023-05-13 — End: 2023-06-01

## 2023-05-13 MED ORDER — DICLOFENAC SODIUM 75 MG PO TBEC: 75.0000 mg | DELAYED_RELEASE_TABLET | Freq: Two times a day (BID) | ORAL | 3 refills | Status: DC

## 2023-05-13 NOTE — Assessment & Plan Note (Signed)
I saw the patient almost 3 weeks ago for same symptoms and treated with expectorant and decongestants. Her cough persists to be croupy and dry. I am sending short course of abx, Nasacort, and refilled duonebs. Instructions for use provided.

## 2023-05-13 NOTE — Progress Notes (Signed)
Acute Office Visit  Subjective:     Patient ID: Kara Weaver, female    DOB: March 19, 1971, 52 y.o.   MRN: [id number]  Chief Complaint  Patient presents with   Follow-up    Persistent congested cough, sob,wheezing.    HPI Patient is in today for  Subjective:     Kara Weaver is a 52 y.o. female here for evaluation of a cough. Onset of symptoms was several weeks ago. I saw her last month for similar complaints. Since that time the patient reports that her symptoms have been gradually worsening since that time. The cough is hoarse, paroxysmal, productive of yellow and tenacious sputum, and raspy and is aggravated by reclining position. Associated symptoms include: change in voice, chills, night sweats, postnasal drip, sputum production, and wheezing. Patient does have a history of asthma. Patient does have a history of environmental allergens. Patient has not traveled recently. Patient does have a history of smoking. Patient has not had a previous chest x-ray. But has a CT/ lung cancer screening 03/25/23 with negative findings at that time. Patient has not had a PPD done.      Review of Systems  Constitutional: Negative.   HENT:  Positive for congestion and sore throat. Negative for sinus pain.   Eyes: Negative.   Respiratory:  Positive for cough, sputum production, shortness of breath and wheezing. Negative for stridor.   Cardiovascular: Negative.   Gastrointestinal: Negative.   Skin: Negative.   Neurological:  Positive for headaches.        Objective:    BP 134/78 (BP Location: Left Arm, Patient Position: Sitting, Cuff Size: Normal)   Pulse 91   Temp 97.6 F (36.4 C)   Resp 20   Ht 5\' 8"  (1.727 m)   Wt 162 lb 6 oz (73.7 kg)   SpO2 99%   BMI 24.69 kg/m    Physical Exam Vitals reviewed.  Constitutional:      Appearance: Normal appearance.  HENT:     Head: Normocephalic.     Right Ear: Tympanic membrane, ear canal and external ear normal.     Left Ear: Tympanic  membrane, ear canal and external ear normal.     Nose: Rhinorrhea present.     Mouth/Throat:     Mouth: Mucous membranes are moist.     Pharynx: No oropharyngeal exudate or posterior oropharyngeal erythema.  Eyes:     Conjunctiva/sclera: Conjunctivae normal.  Cardiovascular:     Rate and Rhythm: Normal rate and regular rhythm.     Pulses: Normal pulses.     Heart sounds: Normal heart sounds.  Pulmonary:     Effort: Pulmonary effort is normal.     Breath sounds: Normal breath sounds.  Abdominal:     General: Bowel sounds are normal.     Palpations: Abdomen is soft.     Tenderness: There is no abdominal tenderness.  Musculoskeletal:     Cervical back: Neck supple. No tenderness.  Lymphadenopathy:     Cervical: Cervical adenopathy present.  Skin:    General: Skin is warm.     Capillary Refill: Capillary refill takes less than 2 seconds.     Coloration: Skin is pale.  Neurological:     Mental Status: She is alert and oriented to person, place, and time.  Psychiatric:        Mood and Affect: Mood normal.        Behavior: Behavior normal.     No results found for any  visits on 05/13/23.      Assessment & Plan:   Problem List Items Addressed This Visit     Upper respiratory tract infection - Primary    I saw the patient almost 3 weeks ago for same symptoms and treated with expectorant and decongestants. Her cough persists to be croupy and dry. I am sending short course of abx, Nasacort, and refilled duonebs. Instructions for use provided.         Relevant Medications   ipratropium (ATROVENT) 0.06 % nasal spray   amoxicillin-clavulanate (AUGMENTIN) 500-125 MG tablet   diclofenac (VOLTAREN) 75 MG EC tablet   ipratropium-albuterol (DUONEB) 0.5-2.5 (3) MG/3ML SOLN    Meds ordered this encounter  Medications   amoxicillin-clavulanate (AUGMENTIN) 500-125 MG tablet    Sig: Take 1 tablet by mouth 3 (three) times daily for 5 days.    Dispense:  15 tablet    Refill:  0    diclofenac (VOLTAREN) 75 MG EC tablet    Sig: Take 1 tablet (75 mg total) by mouth 2 (two) times daily.    Dispense:  30 tablet    Refill:  3   ipratropium-albuterol (DUONEB) 0.5-2.5 (3) MG/3ML SOLN    Sig: Inhale 3 mLs into the lungs every 4 (four) hours as needed.    Dispense:  360 mL    Refill:  2   LORazepam (ATIVAN) 0.5 MG tablet    Sig: Take 1 tablet (0.5 mg total) by mouth every 8 (eight) hours.    Dispense:  90 tablet    Refill:  1    No follow-ups on file.  Edwena Blow, NP

## 2023-05-19 ENCOUNTER — Other Ambulatory Visit: Payer: Self-pay | Admitting: Student

## 2023-05-19 MED ORDER — MOVANTIK 25 MG PO TABS: 25.0000 mg | ORAL_TABLET | Freq: Every day | ORAL | 0 refills | Status: DC

## 2023-05-20 ENCOUNTER — Other Ambulatory Visit: Payer: Self-pay | Admitting: Student

## 2023-05-28 DIAGNOSIS — M85852 Other specified disorders of bone density and structure, left thigh: Secondary | ICD-10-CM | POA: Diagnosis not present

## 2023-05-28 DIAGNOSIS — N184 Chronic kidney disease, stage 4 (severe): Secondary | ICD-10-CM | POA: Diagnosis not present

## 2023-05-28 DIAGNOSIS — M810 Age-related osteoporosis without current pathological fracture: Secondary | ICD-10-CM | POA: Diagnosis not present

## 2023-06-01 ENCOUNTER — Other Ambulatory Visit: Payer: Self-pay | Admitting: Student

## 2023-06-01 DIAGNOSIS — J069 Acute upper respiratory infection, unspecified: Secondary | ICD-10-CM

## 2023-06-02 ENCOUNTER — Other Ambulatory Visit: Payer: Self-pay | Admitting: Student

## 2023-06-02 MED ORDER — AMPHETAMINE-DEXTROAMPHET ER 30 MG PO CP24: 30.0000 mg | ORAL_CAPSULE | Freq: Every day | ORAL | 0 refills | Status: DC

## 2023-06-09 ENCOUNTER — Other Ambulatory Visit: Payer: Self-pay | Admitting: Student

## 2023-06-09 DIAGNOSIS — J069 Acute upper respiratory infection, unspecified: Secondary | ICD-10-CM

## 2023-06-09 MED ORDER — MOVANTIK 25 MG PO TABS: 25.0000 mg | ORAL_TABLET | Freq: Every day | ORAL | 0 refills | Status: DC

## 2023-06-09 MED ORDER — DICLOFENAC SODIUM 75 MG PO TBEC: 75.0000 mg | DELAYED_RELEASE_TABLET | Freq: Two times a day (BID) | ORAL | 3 refills | Status: DC

## 2023-06-16 ENCOUNTER — Other Ambulatory Visit: Payer: Self-pay | Admitting: Student

## 2023-06-17 DIAGNOSIS — R109 Unspecified abdominal pain: Secondary | ICD-10-CM | POA: Diagnosis not present

## 2023-06-17 DIAGNOSIS — Z87448 Personal history of other diseases of urinary system: Secondary | ICD-10-CM | POA: Diagnosis not present

## 2023-06-22 ENCOUNTER — Ambulatory Visit: Payer: 59 | Admitting: Student

## 2023-06-23 DIAGNOSIS — N179 Acute kidney failure, unspecified: Secondary | ICD-10-CM | POA: Diagnosis not present

## 2023-06-23 DIAGNOSIS — N1831 Chronic kidney disease, stage 3a: Secondary | ICD-10-CM | POA: Diagnosis not present

## 2023-06-23 DIAGNOSIS — R109 Unspecified abdominal pain: Secondary | ICD-10-CM | POA: Diagnosis not present

## 2023-06-23 DIAGNOSIS — F17200 Nicotine dependence, unspecified, uncomplicated: Secondary | ICD-10-CM | POA: Diagnosis not present

## 2023-06-23 DIAGNOSIS — N390 Urinary tract infection, site not specified: Secondary | ICD-10-CM | POA: Diagnosis not present

## 2023-06-24 ENCOUNTER — Ambulatory Visit: Payer: 59 | Admitting: Student

## 2023-06-28 ENCOUNTER — Other Ambulatory Visit: Payer: Self-pay

## 2023-06-28 ENCOUNTER — Ambulatory Visit: Payer: 59 | Admitting: Student

## 2023-06-28 ENCOUNTER — Encounter: Payer: Self-pay | Admitting: Student

## 2023-06-28 DIAGNOSIS — M19011 Primary osteoarthritis, right shoulder: Secondary | ICD-10-CM

## 2023-06-28 MED ORDER — CYCLOBENZAPRINE HCL 10 MG PO TABS: 10.0000 mg | ORAL_TABLET | Freq: Three times a day (TID) | ORAL | 2 refills | Status: DC

## 2023-06-29 ENCOUNTER — Other Ambulatory Visit: Payer: Self-pay | Admitting: Student

## 2023-07-09 ENCOUNTER — Other Ambulatory Visit: Payer: Self-pay | Admitting: Student

## 2023-07-09 MED ORDER — AMPHETAMINE-DEXTROAMPHET ER 30 MG PO CP24: 30.0000 mg | ORAL_CAPSULE | Freq: Every day | ORAL | 0 refills | Status: DC

## 2023-07-09 MED ORDER — LORAZEPAM 0.5 MG PO TABS: 0.5000 mg | ORAL_TABLET | Freq: Three times a day (TID) | ORAL | 1 refills | Status: DC

## 2023-07-17 ENCOUNTER — Other Ambulatory Visit: Payer: Self-pay | Admitting: Internal Medicine

## 2023-07-19 ENCOUNTER — Encounter: Payer: Self-pay | Admitting: Student

## 2023-07-19 ENCOUNTER — Ambulatory Visit: Payer: 59 | Admitting: Student

## 2023-07-19 VITALS — BP 124/84 | HR 122 | Temp 98.0°F | Resp 20 | Ht 68.0 in | Wt 156.1 lb

## 2023-07-19 DIAGNOSIS — R300 Dysuria: Secondary | ICD-10-CM | POA: Insufficient documentation

## 2023-07-19 DIAGNOSIS — F900 Attention-deficit hyperactivity disorder, predominantly inattentive type: Secondary | ICD-10-CM | POA: Diagnosis not present

## 2023-07-19 DIAGNOSIS — N184 Chronic kidney disease, stage 4 (severe): Secondary | ICD-10-CM

## 2023-07-19 LAB — POCT URINALYSIS DIPSTICK
Bilirubin, UA: NEGATIVE
Glucose, UA: NEGATIVE
Protein, UA: POSITIVE — AB
Spec Grav, UA: 1.02 (ref 1.010–1.025)
Urobilinogen, UA: 0.2 U/dL
pH, UA: 5.5 (ref 5.0–8.0)

## 2023-07-19 MED ORDER — AMPHETAMINE-DEXTROAMPHET ER 30 MG PO CP24: 30.0000 mg | ORAL_CAPSULE | Freq: Every day | ORAL | 0 refills | Status: DC

## 2023-07-19 NOTE — Assessment & Plan Note (Addendum)
Due to history of recurrent UTIs and urine dipstick showing presence of infection, I will send urine for culture.  Antibiotic to be prescribed after culture results.  She should continue to follow-up with urology and infectious disease.

## 2023-07-19 NOTE — Assessment & Plan Note (Signed)
She continues to exhibit signs of hyperactivity today.  She has not been able to get medication from [facility] due to them being out of stock.  I will send new orders to [facility] home delivery service for Adderall 30 mg once a day.

## 2023-07-19 NOTE — Assessment & Plan Note (Signed)
Kidney function appears to be stable based on chart review from nephrology.  Patient encouraged to keep appointments with nephrology.Kara Weaver

## 2023-07-19 NOTE — Progress Notes (Signed)
Established Patient Office Visit  Subjective   Patient ID: Kara Weaver, female    DOB: 11-Nov-1970  Age: 52 y.o. MRN: [id number]  Chief Complaint  Patient presents with   Follow-up    Patient is here for a follow up visit. Nearing anniversary of family and friends death has her upset. Having urinary issues.    HPI  Kara Weaver is a 52 y.o. female who presents today for 2 month follow up. This patient's past medical history Anxiety, Asthma, Bipolar Disorder, Cholelithiasis, Chronic UTI's, Stage 3a chronic kidney disease, Migraine, Panic Disorder, Post Tramatic Stress Syndrome, and Renal Calculi.   Chronic kidney disease: she is seeing Dr. Elenore Rota on chart review CKD stage III/IV -unclear duration. Likely related to prior history of recurrent nephrolithiasis and obstruction and sepsis that she reports. Recent renal ultrasound and CT scan from last year reviewed. AKI -had resolved last visit with creatinine improving to 1.2 in June. On 05/28/23 Creatinine worse again at 1.8 GFR 32 with CO2 of 19. She has been advised to not use NSAIDs.   The patient is being seen by urology, Kinnie Feil, DNP a CT stone study was ordered, but the patient says that she is unable to afford this. She continues to [place] vaginal estrogen cream for prevention of UTI and it was highly recommended that she begin probiotics but she has not done so. There was a discussion that advised the patient against use of antibiotics unless UTI is present. Today she reports dysuria for 3 weeks and says that it takes her a long time to urinate. She reports that she feels the urge to go then has to wait several minutes until she can go, she notes dark urine without blood, no odor, discharge, no fevers, or abdominal pain.  Right flank pain waxes and wanes, there is presence of kidney stones, her last ultrasound was in April 2023: Right kidney 10.4 cm. Left kidney 10.2 cm with normal echogenicity. She did have bilateral nephrolithiasis with  largest stone measuring 9 mm in the lower pole of the right kidney.   The patient has a history of ADHD where she had taken Adderall in the past.  Prior to starting her back on the medication I asked the patient to receive evaluation from psychiatrist team with [facility].  After written authorization communication was received the patient was placed back on adderall 30 mg once a day for complaints of lack of concentration and difficulty completing task. She feels that they help her with task and with focus.  She denies adverse effects at this time.      Review of Systems  Constitutional: Negative.   Respiratory:  Positive for cough. Negative for sputum production.   Cardiovascular: Negative.   Gastrointestinal: Negative.   Genitourinary:  Positive for dysuria, flank pain and urgency.  Neurological: Negative.   Endo/Heme/Allergies: Negative.   Psychiatric/Behavioral: Negative.        Objective:     BP 124/84 (BP Location: Left Arm, Patient Position: Sitting)   Pulse (!) 122   Temp 98 F (36.7 C) (Temporal)   Resp 20   Ht 5\' 8"  (1.727 m)   Wt 156 lb 2 oz (70.8 kg)   SpO2 99%   BMI 23.74 kg/m    Physical Exam Vitals reviewed.  Constitutional:      Appearance: Normal appearance.  Cardiovascular:     Rate and Rhythm: Regular rhythm. Tachycardia present.     Heart sounds: Normal heart sounds.  No friction rub. No gallop.  Pulmonary:     Effort: Pulmonary effort is normal.     Breath sounds: Examination of the right-upper field reveals decreased breath sounds. Decreased breath sounds present. No wheezing, rhonchi or rales.  Abdominal:     General: Bowel sounds are normal. There is no distension.     Palpations: Abdomen is soft.     Tenderness: There is right CVA tenderness. There is no left CVA tenderness.  Neurological:     General: No focal deficit present.     Mental Status: She is alert and oriented to person, place, and time.  Psychiatric:        Behavior:  Behavior normal.      Results for orders placed or performed in visit on 07/19/23  POCT urinalysis dipstick  Result Value Ref Range   Color, UA Yellow    Clarity, UA Cloudy    Glucose, UA Negative Negative   Bilirubin, UA Negative    Ketones, UA Trace    Spec Grav, UA 1.020 1.010 - 1.025   Blood, UA Moderate    pH, UA 5.5 5.0 - 8.0   Protein, UA Positive (A) Negative   Urobilinogen, UA 0.2 0.2 or 1.0 E.U./dL   Nitrite, UA Postitive    Leukocytes, UA Large (3+) (A) Negative   Appearance     Odor         Assessment & Plan:   Problem List Items Addressed This Visit     ADHD (attention deficit hyperactivity disorder), inattentive type   She continues to exhibit signs of hyperactivity today.  She has not been able to get medication from [facility] due to them being out of stock.  I will send new orders to [facility] home delivery service for Adderall 30 mg once a day.      Chronic kidney disease   Kidney function appears to be stable based on chart review from nephrology.  Patient encouraged to keep appointments with nephrology..      Dysuria - Primary   Due to history of recurrent UTIs and urine dipstick showing presence of infection, I will send urine for culture.  Antibiotic to be prescribed after culture results.  She should continue to follow-up with urology and infectious disease.      Relevant Orders   POCT urinalysis dipstick (Completed)   Culture, Urine    No follow-ups on file.    Edwena Blow, NP

## 2023-07-21 ENCOUNTER — Other Ambulatory Visit: Payer: Self-pay | Admitting: Student

## 2023-07-22 LAB — URINE CULTURE

## 2023-07-23 ENCOUNTER — Other Ambulatory Visit: Payer: Self-pay | Admitting: Student

## 2023-07-23 DIAGNOSIS — G43019 Migraine without aura, intractable, without status migrainosus: Secondary | ICD-10-CM

## 2023-07-23 DIAGNOSIS — M19011 Primary osteoarthritis, right shoulder: Secondary | ICD-10-CM

## 2023-07-23 MED ORDER — CYCLOBENZAPRINE HCL 10 MG PO TABS: 10.0000 mg | ORAL_TABLET | Freq: Three times a day (TID) | ORAL | 2 refills | Status: AC

## 2023-07-27 ENCOUNTER — Other Ambulatory Visit: Payer: Self-pay | Admitting: Student

## 2023-07-27 ENCOUNTER — Encounter: Payer: Self-pay | Admitting: Student

## 2023-07-27 DIAGNOSIS — N390 Urinary tract infection, site not specified: Secondary | ICD-10-CM | POA: Diagnosis not present

## 2023-07-27 DIAGNOSIS — Z79899 Other long term (current) drug therapy: Secondary | ICD-10-CM | POA: Diagnosis not present

## 2023-07-27 DIAGNOSIS — E7800 Pure hypercholesterolemia, unspecified: Secondary | ICD-10-CM | POA: Diagnosis not present

## 2023-07-27 DIAGNOSIS — Z792 Long term (current) use of antibiotics: Secondary | ICD-10-CM | POA: Diagnosis not present

## 2023-07-27 DIAGNOSIS — F17210 Nicotine dependence, cigarettes, uncomplicated: Secondary | ICD-10-CM | POA: Diagnosis not present

## 2023-07-27 DIAGNOSIS — N200 Calculus of kidney: Secondary | ICD-10-CM | POA: Diagnosis not present

## 2023-07-27 DIAGNOSIS — N189 Chronic kidney disease, unspecified: Secondary | ICD-10-CM | POA: Diagnosis not present

## 2023-07-27 DIAGNOSIS — N3001 Acute cystitis with hematuria: Secondary | ICD-10-CM | POA: Diagnosis not present

## 2023-08-14 ENCOUNTER — Other Ambulatory Visit: Payer: Self-pay | Admitting: Student

## 2023-08-14 DIAGNOSIS — J069 Acute upper respiratory infection, unspecified: Secondary | ICD-10-CM

## 2023-08-18 ENCOUNTER — Other Ambulatory Visit: Payer: Self-pay | Admitting: Student

## 2023-08-18 DIAGNOSIS — J069 Acute upper respiratory infection, unspecified: Secondary | ICD-10-CM

## 2023-08-26 ENCOUNTER — Other Ambulatory Visit: Payer: Self-pay | Admitting: Student

## 2023-08-26 MED ORDER — AMPHETAMINE-DEXTROAMPHET ER 30 MG PO CP24: 30.0000 mg | ORAL_CAPSULE | Freq: Every day | ORAL | 0 refills | Status: DC

## 2023-08-31 ENCOUNTER — Ambulatory Visit: Payer: 59 | Admitting: Student

## 2023-09-01 ENCOUNTER — Encounter: Payer: Self-pay | Admitting: Student

## 2023-09-01 ENCOUNTER — Ambulatory Visit: Payer: 59 | Admitting: Student

## 2023-09-01 VITALS — BP 102/74 | HR 113 | Temp 97.6°F | Resp 20 | Ht 68.0 in | Wt 149.4 lb

## 2023-09-01 DIAGNOSIS — J432 Centrilobular emphysema: Secondary | ICD-10-CM | POA: Diagnosis not present

## 2023-09-01 MED ORDER — ONDANSETRON HCL 4 MG PO TABS: 4.0000 mg | ORAL_TABLET | Freq: Three times a day (TID) | ORAL | 0 refills | Status: AC | PRN

## 2023-09-01 MED ORDER — PREDNISONE 20 MG PO TABS: 40.0000 mg | ORAL_TABLET | Freq: Every day | ORAL | 0 refills | Status: AC

## 2023-09-01 NOTE — Progress Notes (Signed)
Acute Office Visit  Subjective:     Patient ID: Kara Weaver, female    DOB: Jan 01, 1971, 53 y.o.   MRN: [id number]  Chief Complaint  Patient presents with   Shortness of Breath    X couple weeks, productive cough, unsure of color. SOB after walking a few feet and her heart rate has been up.    HPI  Subjective:     Kara Weaver is a 53 y.o. female here for evaluation of a cough. Onset of symptoms was 3 weeks ago. Symptoms have been unchanged since that time. The cough is painful, productive, and raspy and is aggravated by nothing. Associated symptoms include: night sweats, shortness of breath, and sputum production. Patient does have a history of asthma. Patient does have a history of environmental allergens. Patient has not traveled recently. Patient does have a history of smoking. Patient has not had a previous chest x-ray. Patient has not had a PPD done in man years. She had a CT of her chest in August for lung cancer screening. Findings concluded mild centrilobular emphysema with diffuse bronchial wall thickening. No acute consolidative airspace disease or lung mass. Small 4 mm solid anterior left lower lobe. No significant pulmonary nodules. She continues to smoke.   The patient reports using albuterol neb and inhaler but says that her heart rate elevates up to 140 so she is not using this as often. Denies use of other OTC measures.    Review of Systems  Constitutional:  Positive for chills. Negative for fever.  HENT: Negative.    Eyes: Negative.   Respiratory:  Positive for cough, sputum production and shortness of breath. Negative for wheezing.   Cardiovascular: Negative.   Skin: Negative.   Neurological: Negative.         Objective:    BP 102/74 (BP Location: Left Arm, Patient Position: Sitting)   Pulse (!) 113   Temp 97.6 F (36.4 C) (Temporal)   Resp 20   Ht 5\' 8"  (1.727 m)   Wt 149 lb 6 oz (67.8 kg)   SpO2 98%   BMI 22.71 kg/m    Physical Exam Vitals  reviewed.  Constitutional:      Appearance: She is well-developed and normal weight.  HENT:     Mouth/Throat:     Mouth: Mucous membranes are moist.     Pharynx: Oropharynx is clear. No pharyngeal swelling or oropharyngeal exudate.  Eyes:     Extraocular Movements: Extraocular movements intact.     Pupils: Pupils are equal, round, and reactive to light.  Cardiovascular:     Rate and Rhythm: Regular rhythm. Tachycardia present.     Heart sounds: No murmur heard. Pulmonary:     Effort: Pulmonary effort is normal. No respiratory distress.     Breath sounds: No stridor. Examination of the right-upper field reveals decreased breath sounds. Examination of the left-upper field reveals decreased breath sounds. Examination of the right-lower field reveals decreased breath sounds. Examination of the left-lower field reveals decreased breath sounds. Decreased breath sounds present. No wheezing, rhonchi or rales.  Chest:     Chest wall: No tenderness or crepitus. There is no dullness to percussion.  Abdominal:     General: Bowel sounds are normal.     Palpations: Abdomen is soft.  Musculoskeletal:     Cervical back: Normal range of motion and neck supple.     Right lower leg: No tenderness. No edema.     Left lower leg: No tenderness.  No edema.  Skin:    General: Skin is warm and dry.     Capillary Refill: Capillary refill takes less than 2 seconds.  Neurological:     General: No focal deficit present.     Mental Status: She is alert.  Psychiatric:        Mood and Affect: Mood normal.        Behavior: Behavior normal.     No results found for any visits on 09/01/23.      Assessment & Plan:   Problem List Items Addressed This Visit     Centrilobular emphysema (HCC) - Primary   I stressed the importance of smoking cessation. Her oxygen levels maintained with walking, no distress 98%.  Chest wall PT for airway clearance demonstrated at visit.    She has albuterol nebs prn and  Dulera, she should continue these therapies. Pulmonology referral placed.   I will send in Prednisone 40 mg for 5 days.        Relevant Medications   predniSONE (DELTASONE) 20 MG tablet   Other Relevant Orders   Ambulatory referral to Pulmonology    Meds ordered this encounter  Medications   predniSONE (DELTASONE) 20 MG tablet    Sig: Take 2 tablets (40 mg total) by mouth daily with breakfast for 5 days.    Dispense:  10 tablet    Refill:  0   ondansetron (ZOFRAN) 4 MG tablet    Sig: Take 1 tablet (4 mg total) by mouth every 8 (eight) hours as needed for nausea or vomiting.    Dispense:  15 tablet    Refill:  0    No follow-ups on file.  Edwena Blow, NP

## 2023-09-01 NOTE — Assessment & Plan Note (Addendum)
I stressed the importance of smoking cessation. Her oxygen levels maintained with walking, no distress 98%.  Chest wall PT for airway clearance demonstrated at visit.    She has albuterol nebs prn and Dulera, she should continue these therapies. Pulmonology referral placed.   I will send in Prednisone 40 mg for 5 days.

## 2023-09-03 ENCOUNTER — Telehealth: Payer: Self-pay

## 2023-09-07 NOTE — Telephone Encounter (Signed)
This message was sent to provider

## 2023-09-24 DIAGNOSIS — Z0000 Encounter for general adult medical examination without abnormal findings: Secondary | ICD-10-CM

## 2023-09-29 ENCOUNTER — Other Ambulatory Visit: Payer: Self-pay

## 2023-09-29 DIAGNOSIS — G43019 Migraine without aura, intractable, without status migrainosus: Secondary | ICD-10-CM

## 2023-09-29 MED ORDER — NURTEC 75 MG PO TBDP: ORAL_TABLET | ORAL | 6 refills | Status: AC

## 2023-09-30 ENCOUNTER — Other Ambulatory Visit: Payer: Self-pay | Admitting: Student

## 2023-09-30 MED ORDER — LORAZEPAM 0.5 MG PO TABS: 0.5000 mg | ORAL_TABLET | Freq: Three times a day (TID) | ORAL | 0 refills | Status: DC | PRN

## 2023-10-08 DIAGNOSIS — M6289 Other specified disorders of muscle: Secondary | ICD-10-CM | POA: Diagnosis not present

## 2023-10-08 DIAGNOSIS — N390 Urinary tract infection, site not specified: Secondary | ICD-10-CM | POA: Diagnosis not present

## 2023-10-08 DIAGNOSIS — R109 Unspecified abdominal pain: Secondary | ICD-10-CM | POA: Diagnosis not present

## 2023-10-12 ENCOUNTER — Ambulatory Visit: Payer: 59 | Admitting: Student

## 2023-10-12 VITALS — BP 130/72 | HR 113 | Temp 98.0°F | Resp 20 | Ht 68.6 in | Wt 152.0 lb

## 2023-10-12 DIAGNOSIS — N184 Chronic kidney disease, stage 4 (severe): Secondary | ICD-10-CM

## 2023-10-12 DIAGNOSIS — F900 Attention-deficit hyperactivity disorder, predominantly inattentive type: Secondary | ICD-10-CM | POA: Diagnosis not present

## 2023-10-12 DIAGNOSIS — G43019 Migraine without aura, intractable, without status migrainosus: Secondary | ICD-10-CM

## 2023-10-12 DIAGNOSIS — Z5181 Encounter for therapeutic drug level monitoring: Secondary | ICD-10-CM

## 2023-10-12 DIAGNOSIS — E785 Hyperlipidemia, unspecified: Secondary | ICD-10-CM | POA: Diagnosis not present

## 2023-10-12 DIAGNOSIS — Z79899 Other long term (current) drug therapy: Secondary | ICD-10-CM | POA: Diagnosis not present

## 2023-10-12 DIAGNOSIS — F419 Anxiety disorder, unspecified: Secondary | ICD-10-CM | POA: Diagnosis not present

## 2023-10-12 DIAGNOSIS — J432 Centrilobular emphysema: Secondary | ICD-10-CM

## 2023-10-12 LAB — POCT URINE DRUG SCREEN
Methylenedioxyamphetamine: NOT DETECTED
POC Amphetamine UR: POSITIVE — AB
POC BENZODIAZEPINES UR: POSITIVE — AB
POC Barbiturate UR: NOT DETECTED
POC Cocaine UR: NOT DETECTED
POC DRUG SCREEN OXIDANTS URINE: NORMAL
POC Ecstasy UR: NOT DETECTED
POC Marijuana UR: NOT DETECTED
POC Methadone UR: NOT DETECTED
POC Methamphetamine UR: NOT DETECTED
POC Opiate Ur: NOT DETECTED
POC Oxycodone UR: NOT DETECTED
POC PH URINE: NORMAL
POC PHENCYCLIDINE UR: NOT DETECTED
POC SPECIFIC GRAVITY URINE: NORMAL
POC TRICYCLICS UR: NOT DETECTED
URINE TEMPERATURE: 90.2 [degF] (ref 90.0–100.0)

## 2023-10-12 MED ORDER — AMPHETAMINE-DEXTROAMPHET ER 30 MG PO CP24: 30.0000 mg | ORAL_CAPSULE | Freq: Every day | ORAL | 0 refills | Status: AC

## 2023-10-12 MED ORDER — EMGALITY 120 MG/ML ~~LOC~~ SOAJ: SUBCUTANEOUS | 3 refills | Status: AC

## 2023-10-12 MED ORDER — LORAZEPAM 0.5 MG PO TABS: 0.5000 mg | ORAL_TABLET | Freq: Three times a day (TID) | ORAL | 0 refills | Status: AC | PRN

## 2023-10-12 NOTE — Assessment & Plan Note (Signed)
 The patient's migraines have been well-controlled with Nurtec.  We discussed the use of Emgality injections every 30 days as it looks like she has missed her last dose.  Medication has been sent in for refill.

## 2023-10-12 NOTE — Assessment & Plan Note (Signed)
 Patient says that she is stable on Adderall.  Random drug screen was collected today and is positive for amphetamines.  Medication to be refilled.

## 2023-10-12 NOTE — Addendum Note (Signed)
 Addended by: Edwena Blow on: 10/12/2023 03:04 PM   Modules accepted: Orders

## 2023-10-12 NOTE — Progress Notes (Signed)
 Established Patient Office Visit  Subjective   Patient ID: Kara Weaver, female    DOB: 12/08/1970  Age: 53 y.o. MRN: [id number]  Chief Complaint  Patient presents with   Follow-up    HPI  Kara Weaver is a 53 y.o. female who presents today for 2 month follow up. This patient's past medical history Anxiety, Asthma, Bipolar Disorder, Cholelithiasis, Chronic UTI's, Stage 3a chronic kidney disease, Migraine, Panic Disorder, Post Tramatic Stress Syndrome, and Renal Calculi.    Chronic kidney disease: she is seeing Dr. Elenore Rota on chart review CKD stage III/IV -unclear duration. Likely related to prior history of recurrent nephrolithiasis and obstruction and sepsis that she reports. Recent renal ultrasound and CT scan from last year reviewed. AKI -had resolved last visit with creatinine improving to 1.2 in June. On 05/28/23 Creatinine worse again at 1.8 GFR 32 with CO2 of 19. She has been advised to not use NSAIDs.    The patient is being seen by urology, Kinnie Feil, DNP a CT stone study was ordered, but the patient says that she is unable to afford this. She continues to [place] vaginal estrogen cream for prevention of UTI and it was highly recommended that she begin probiotics but she has not done so. There was a discussion that advised the patient against use of antibiotics unless UTI is present. Today she reports dysuria for 3 weeks and says that it takes her a long time to urinate. She reports that she feels the urge to go then has to wait several minutes until she can go, she notes dark urine without blood, no odor, discharge, no fevers, or abdominal pain.  Right flank pain waxes and wanes, there is presence of kidney stones, her last ultrasound was in April 2023: Right kidney 10.4 cm. Left kidney 10.2 cm with normal echogenicity. She did have bilateral nephrolithiasis with largest stone measuring 9 mm in the lower pole of the right kidney.    The patient has a history of ADHD where she had taken  Adderall in the past.  Prior to starting her back on the medication I asked the patient to receive evaluation from psychiatrist team with [facility].  After written authorization communication was received the patient was placed back on adderall 30 mg once a day for complaints of lack of concentration and difficulty completing task. She feels that they help her with task and with focus.  She denies adverse effects at this time.   Kara Weaver is a 53 year old female who returns for 3 month follow up.   Recurrent UTI: The patient is being followed by urology. She was last seen  October 08, 2023 for recurrent UTIs, vaginal atrophy, high tone pelvic floor dysfunction and occasional dyspareunia, mixed urgency incontinence, history of kidney stone with urosepsis, and intermittent bilateral flank pain. She reports that voiding frequency varies depending of fluid intake. She reports burning sensation with urination once in a while. A CT Urogram was done on July 27, 2023, it showed bilateral nonobstructing nephrolithiasis.  The patient tells me that she may undergo a procedure for her kidney stones.  Otherwise she will continue following urology every 6 months or as needed.  Chronic kidney disease: she is seeing Dr. Elenore Rota on chart review CKD stage III/IV -unclear duration. Likely related to prior history of recurrent nephrolithiasis and obstruction and sepsis that she reports. Recent renal ultrasound and CT scan from last year reviewed. AKI -had resolved last visit with creatinine improving to 1.2 in  June. On 05/28/23 Creatinine worse again at 1.8 GFR 32 with CO2 of 19. She has been advised to not use NSAIDs.  She has an appointment on March 20 with nephrology .  ADHD: Patient was started back on adderall 30 mg once a day. She feels that they help her with task and with focus.  She denies adverse effects at this time. Patient continues to see [facility] for management of Bipolar 1 disorder. She denies concerns with  medications.   Patient reports an increase in panic attacks over the past 2 months. She says she is unsure why they have increased, sometimes she wakes up in the morning with a panic attack. Pt reports a trigger is being around people, she shows me her nails, she has bitten off all the artificial nails during an attack. Using lorazepam 0.5 mg prn where she reports some relief. Pt denies feelings of suicidal ideation, homicidal ideation, worthlessness, helplessness, hypersomnia, or illicit drug use.   Emphysema: patient continues to cough up thick white/yellow phlegm. She had a CT of her chest in August for lung cancer screening. Findings concluded mild centrilobular emphysema with diffuse bronchial wall thickening. No acute consolidative airspace disease or lung mass. Small 4 mm solid anterior left lower lobe. No significant pulmonary nodules. She continues to smoke. We treated her with prednisone 40 mg for 5 days. Continues Dulera and albuteral nebs PRN. A referral was placed to pulmonology, she will see them March 20th.   Migraine: she says her migraines are controlled with nurtec 75 mg every other day. She is also using emgality 120 mg injection once ever 30 days, her last dose was in January. She denies dizziness, blurred vision, or GI upset.     Patient brings a disability update report to complete. She has been on disability since 2018  Review of Systems  Constitutional: Negative.   HENT: Negative.    Eyes: Negative.   Respiratory: Negative.    Cardiovascular: Negative.   Gastrointestinal: Negative.   Genitourinary: Negative.   Musculoskeletal: Negative.   Skin: Negative.   Neurological: Negative.   Psychiatric/Behavioral:  The patient is nervous/anxious.       Objective:     BP 130/72   Pulse (!) 113   Temp 98 F (36.7 C)   Resp 20   Ht 5' 8.6" (1.742 m)   Wt 152 lb (68.9 kg)   SpO2 98%   BMI 22.71 kg/m    Physical Exam Vitals reviewed.  Constitutional:       Appearance: Normal appearance.  Cardiovascular:     Rate and Rhythm: Normal rate and regular rhythm.     Pulses: Normal pulses.     Heart sounds: Normal heart sounds.  Pulmonary:     Effort: Pulmonary effort is normal.     Breath sounds: Decreased breath sounds present.  Abdominal:     General: Abdomen is flat. Bowel sounds are normal.     Palpations: Abdomen is soft.  Musculoskeletal:        General: Normal range of motion.  Skin:    General: Skin is warm and dry.     Capillary Refill: Capillary refill takes less than 2 seconds.  Neurological:     General: No focal deficit present.     Mental Status: She is alert and oriented to person, place, and time.  Psychiatric:        Attention and Perception: Attention normal.        Mood and Affect: Mood is anxious.  Speech: Speech is rapid and pressured.        Behavior: Behavior is cooperative.        Thought Content: Thought content normal. Thought content is not paranoid or delusional. Thought content does not include homicidal or suicidal ideation. Thought content does not include homicidal or suicidal plan.        Cognition and Memory: Cognition normal.        Judgment: Judgment normal.      Results for orders placed or performed in visit on 10/12/23  POCT Urine drug screen  Result Value Ref Range   POC Methamphetamine UR None Detected None Detected   POC Opiate Ur None Detected None Detected   POC Barbiturate UR None Detected None Detected   POC Amphetamine UR Positive (A) None Detected   POC Oxycodone UR None Detected None Detected   POC Cocaine UR None Detected None Detected   POC Ecstasy UR None Detected None Detected   POC TRICYCLICS UR None Detected None Detected   POC PHENCYCLIDINE UR None Detected None Detected   POC Marijuana UR None Detected None Detected   POC Methadone UR None Detected None Detected   POC BENZODIAZEPINES UR Positive (A) None Detected   URINE TEMPERATURE 90.2 90.0 - 100.0 Degrees F   POC  DRUG SCREEN OXIDANTS URINE Normal Normal   POC SPECIFIC GRAVITY URINE Normal Normal   POC PH URINE Normal Normal   Methylenedioxyamphetamine None Detected None Detected      The ASCVD Risk score (Arnett DK, et al., 2019) failed to calculate for the following reasons:   The valid total cholesterol range is 130 to 320 mg/dL    Assessment & Plan:   Problem List Items Addressed This Visit     Common migraine with intractable migraine   The patient's migraines have been well-controlled with Nurtec.  We discussed the use of Emgality injections every 30 days as it looks like she has missed her last dose.  Medication has been sent in for refill.      Relevant Medications   EMGALITY 120 MG/ML SOAJ   ADHD (attention deficit hyperactivity disorder), inattentive type   Patient says that she is stable on Adderall.  Random drug screen was collected today and is positive for amphetamines.  Medication to be refilled.      Chronic kidney disease - Primary   She will continue seeing Nephrology.  Her last CMP was collected in October 2024, we will update this lab today for upcoming nephrology appointment.       Relevant Orders   Comprehensive metabolic panel   CBC with Diff   Dyslipidemia   Repeat lipid panel, patient using atorvastatin 20 mg daily.       Relevant Orders   Lipid Profile   Anxiety   Her anxiety is somewhat controlled with lorazepam 0.5 mg every 8 as needed.  The patient continues to be followed by [facility].    UDS is performed for the purposes of identifying presence of illicit substances or consistent monitoring prior to initiation of treatment involving controlled medications and to monitor adherence to a treatment plan that includes a validated risk assessment, stratification and monitoring protocol.  Tests are done randomly to test for prescribed medications and potential for abuse, misuse, and diversion.  I have personally reviewed the [place] recipient query regarding  this patient and do not see any irregularities or concerning findings.  Controlled Substance Discussion: Discussed risk of controlled substances including possible unintentional overdose, especially if  mixed with alcohol or other pills; typical speech given including illegal to share, always keep in the original bottle, safeguard medicine, do NOT mix with alcohol, other pain pills, "nerve" or anxiety pills, or sleeping pills; I am not obligated to approve of early refill or give new prescription if medicine is lost, stolen, or destroyed even with a police report, etc.; patient agrees with plan.        Relevant Orders   POCT Urine drug screen (Completed)   Centrilobular emphysema (HCC)   Lung sounds are still quite diminished in the absence of cough or shortness of breath.  She continues to use albuterol nebulizer and Dulera inhaler.   I advised her to keep her upcoming appointment with pulmonology this month.  Patient verbalizes agreement.       Return in about 3 months (around 01/12/2024), or if symptoms worsen or fail to improve.    Edwena Blow, NP

## 2023-10-12 NOTE — Patient Instructions (Signed)
HDL removes extra cholesterol and plaque buildup in your arteries and then sends it to your liver to remove it from your body; this helps reduce your risk of heart disease, heart attack, and stroke.  Foods that increase HDL include beans and legumes, whole grains, high-fiber fruits:prunes, apples, and pears; fatty fish- salmon, tuna, sardines; nuts, olive oil.

## 2023-10-12 NOTE — Assessment & Plan Note (Addendum)
 Her anxiety is somewhat controlled with lorazepam 0.5 mg every 8 as needed.  The patient continues to be followed by [facility].    UDS is performed for the purposes of identifying presence of illicit substances or consistent monitoring prior to initiation of treatment involving controlled medications and to monitor adherence to a treatment plan that includes a validated risk assessment, stratification and monitoring protocol.  Tests are done randomly to test for prescribed medications and potential for abuse, misuse, and diversion.  I have personally reviewed the NCCS[place] recipient query regarding this patient and do not see any irregularities or concerning findings.  Controlled Substance Discussion: Discussed risk of controlled substances including possible unintentional overdose, especially if mixed with alcohol or other pills; typical speech given including illegal to share, always keep in the original bottle, safeguard medicine, do NOT mix with alcohol, other pain pills, "nerve" or anxiety pills, or sleeping pills; I am not obligated to approve of early refill or give new prescription if medicine is lost, stolen, or destroyed even with a police report, etc.; patient agrees with plan.

## 2023-10-12 NOTE — Assessment & Plan Note (Addendum)
 She will continue seeing Nephrology.  Her last CMP was collected in October 2024, we will update this lab today for upcoming nephrology appointment.

## 2023-10-12 NOTE — Assessment & Plan Note (Signed)
 Lung sounds are still quite diminished in the absence of cough or shortness of breath.  She continues to use albuterol nebulizer and Dulera inhaler.   I advised her to keep her upcoming appointment with pulmonology this month.  Patient verbalizes agreement.

## 2023-10-12 NOTE — Assessment & Plan Note (Signed)
 Repeat lipid panel, patient using atorvastatin 20 mg daily.

## 2023-10-13 ENCOUNTER — Other Ambulatory Visit: Payer: Self-pay | Admitting: Student

## 2023-10-13 LAB — CBC WITH DIFFERENTIAL/PLATELET
Basophils Absolute: 0.1 10*3/uL (ref 0.0–0.2)
Basos: 1 %
EOS (ABSOLUTE): 0.1 10*3/uL (ref 0.0–0.4)
Eos: 1 %
Hematocrit: 40.4 % (ref 34.0–46.6)
Hemoglobin: 13.2 g/dL (ref 11.1–15.9)
Immature Grans (Abs): 0 10*3/uL (ref 0.0–0.1)
Immature Granulocytes: 0 %
Lymphocytes Absolute: 2.5 10*3/uL (ref 0.7–3.1)
Lymphs: 40 %
MCH: 29.6 pg (ref 26.6–33.0)
MCHC: 32.7 g/dL (ref 31.5–35.7)
MCV: 91 fL (ref 79–97)
Monocytes Absolute: 0.4 10*3/uL (ref 0.1–0.9)
Monocytes: 6 %
Neutrophils Absolute: 3.3 10*3/uL (ref 1.4–7.0)
Neutrophils: 52 %
Platelets: 380 10*3/uL (ref 150–450)
RBC: 4.46 x10E6/uL (ref 3.77–5.28)
RDW: 14 % (ref 11.7–15.4)
WBC: 6.2 10*3/uL (ref 3.4–10.8)

## 2023-10-13 LAB — LIPID PANEL
Chol/HDL Ratio: 7 ratio — ABNORMAL HIGH (ref 0.0–4.4)
Cholesterol, Total: 303 mg/dL — ABNORMAL HIGH (ref 100–199)
HDL: 43 mg/dL (ref >39)
LDL Chol Calc (NIH): 178 mg/dL — ABNORMAL HIGH (ref 0–99)
Triglycerides: 414 mg/dL — ABNORMAL HIGH (ref 0–149)
VLDL Cholesterol Cal: 82 mg/dL — ABNORMAL HIGH (ref 5–40)

## 2023-10-13 LAB — COMPREHENSIVE METABOLIC PANEL
ALT: 12 IU/L (ref 0–32)
AST: 20 IU/L (ref 0–40)
Albumin: 4.9 g/dL (ref 3.8–4.9)
Alkaline Phosphatase: 92 IU/L (ref 44–121)
BUN/Creatinine Ratio: 8 — ABNORMAL LOW (ref 9–23)
BUN: 15 mg/dL (ref 6–24)
Bilirubin Total: <0.2 mg/dL (ref 0.0–1.2)
CO2: 18 mmol/L — ABNORMAL LOW (ref 20–29)
Calcium: 9.6 mg/dL (ref 8.7–10.2)
Chloride: 106 mmol/L (ref 96–106)
Creatinine, Ser: 1.87 mg/dL — ABNORMAL HIGH (ref 0.57–1.00)
Globulin, Total: 2.4 g/dL (ref 1.5–4.5)
Glucose: 84 mg/dL (ref 70–99)
Potassium: 4.7 mmol/L (ref 3.5–5.2)
Sodium: 142 mmol/L (ref 134–144)
Total Protein: 7.3 g/dL (ref 6.0–8.5)
eGFR: 32 mL/min/{1.73_m2} — ABNORMAL LOW (ref >59)

## 2023-10-13 MED ORDER — ATORVASTATIN CALCIUM 40 MG PO TABS: 40.0000 mg | ORAL_TABLET | Freq: Every day | ORAL | 2 refills | Status: AC

## 2023-10-13 MED ORDER — ATORVASTATIN CALCIUM 20 MG PO TABS: 40.0000 mg | ORAL_TABLET | Freq: Every day | ORAL | 2 refills | Status: DC

## 2023-10-14 ENCOUNTER — Other Ambulatory Visit: Payer: Self-pay | Admitting: Internal Medicine

## 2023-10-18 DIAGNOSIS — Z1231 Encounter for screening mammogram for malignant neoplasm of breast: Secondary | ICD-10-CM | POA: Diagnosis not present

## 2023-10-19 ENCOUNTER — Encounter: Payer: Self-pay | Admitting: Student

## 2023-10-19 ENCOUNTER — Other Ambulatory Visit: Payer: Self-pay | Admitting: Internal Medicine

## 2023-10-26 DIAGNOSIS — F17200 Nicotine dependence, unspecified, uncomplicated: Secondary | ICD-10-CM | POA: Diagnosis not present

## 2023-10-26 DIAGNOSIS — E872 Acidosis, unspecified: Secondary | ICD-10-CM | POA: Diagnosis not present

## 2023-10-26 DIAGNOSIS — N1832 Chronic kidney disease, stage 3b: Secondary | ICD-10-CM | POA: Diagnosis not present

## 2023-10-26 DIAGNOSIS — N390 Urinary tract infection, site not specified: Secondary | ICD-10-CM | POA: Diagnosis not present

## 2023-10-28 ENCOUNTER — Other Ambulatory Visit: Payer: Self-pay

## 2023-10-28 MED ORDER — MOVANTIK 25 MG PO TABS: 25.0000 mg | ORAL_TABLET | Freq: Every day | ORAL | 0 refills | Status: AC

## 2023-11-05 ENCOUNTER — Other Ambulatory Visit: Payer: Self-pay | Admitting: Student

## 2023-11-05 DIAGNOSIS — J069 Acute upper respiratory infection, unspecified: Secondary | ICD-10-CM

## 2023-11-15 ENCOUNTER — Other Ambulatory Visit: Payer: Self-pay

## 2023-11-15 DIAGNOSIS — M19011 Primary osteoarthritis, right shoulder: Secondary | ICD-10-CM

## 2023-11-15 MED ORDER — GABAPENTIN 800 MG PO TABS: 800.0000 mg | ORAL_TABLET | Freq: Two times a day (BID) | ORAL | 3 refills | Status: AC

## 2023-11-16 DIAGNOSIS — Z131 Encounter for screening for diabetes mellitus: Secondary | ICD-10-CM | POA: Diagnosis not present

## 2023-11-16 DIAGNOSIS — R0602 Shortness of breath: Secondary | ICD-10-CM | POA: Diagnosis not present

## 2023-11-16 DIAGNOSIS — Z79899 Other long term (current) drug therapy: Secondary | ICD-10-CM | POA: Diagnosis not present

## 2023-11-16 DIAGNOSIS — E559 Vitamin D deficiency, unspecified: Secondary | ICD-10-CM | POA: Diagnosis not present

## 2023-11-16 DIAGNOSIS — R9431 Abnormal electrocardiogram [ECG] [EKG]: Secondary | ICD-10-CM | POA: Diagnosis not present

## 2023-11-16 DIAGNOSIS — E7800 Pure hypercholesterolemia, unspecified: Secondary | ICD-10-CM | POA: Diagnosis not present

## 2023-11-16 DIAGNOSIS — Z6823 Body mass index (BMI) 23.0-23.9, adult: Secondary | ICD-10-CM | POA: Diagnosis not present

## 2023-11-16 DIAGNOSIS — E039 Hypothyroidism, unspecified: Secondary | ICD-10-CM | POA: Diagnosis not present

## 2023-11-16 DIAGNOSIS — Z1159 Encounter for screening for other viral diseases: Secondary | ICD-10-CM | POA: Diagnosis not present

## 2023-11-29 ENCOUNTER — Other Ambulatory Visit: Payer: Self-pay | Admitting: Student

## 2023-11-29 DIAGNOSIS — E039 Hypothyroidism, unspecified: Secondary | ICD-10-CM | POA: Diagnosis not present

## 2023-11-29 DIAGNOSIS — R809 Proteinuria, unspecified: Secondary | ICD-10-CM | POA: Diagnosis not present

## 2023-11-29 DIAGNOSIS — N200 Calculus of kidney: Secondary | ICD-10-CM | POA: Diagnosis not present

## 2023-11-29 DIAGNOSIS — N390 Urinary tract infection, site not specified: Secondary | ICD-10-CM | POA: Diagnosis not present

## 2023-11-29 DIAGNOSIS — J383 Other diseases of vocal cords: Secondary | ICD-10-CM | POA: Diagnosis not present

## 2023-11-29 DIAGNOSIS — N189 Chronic kidney disease, unspecified: Secondary | ICD-10-CM | POA: Diagnosis not present

## 2023-11-29 DIAGNOSIS — Z6822 Body mass index (BMI) 22.0-22.9, adult: Secondary | ICD-10-CM | POA: Diagnosis not present

## 2023-11-29 DIAGNOSIS — E7800 Pure hypercholesterolemia, unspecified: Secondary | ICD-10-CM | POA: Diagnosis not present

## 2023-11-29 DIAGNOSIS — K219 Gastro-esophageal reflux disease without esophagitis: Secondary | ICD-10-CM | POA: Diagnosis not present

## 2023-12-03 DIAGNOSIS — Z79899 Other long term (current) drug therapy: Secondary | ICD-10-CM | POA: Diagnosis not present

## 2023-12-09 DIAGNOSIS — J383 Other diseases of vocal cords: Secondary | ICD-10-CM | POA: Diagnosis not present

## 2023-12-14 DIAGNOSIS — R9431 Abnormal electrocardiogram [ECG] [EKG]: Secondary | ICD-10-CM | POA: Diagnosis not present

## 2023-12-15 DIAGNOSIS — H25813 Combined forms of age-related cataract, bilateral: Secondary | ICD-10-CM | POA: Diagnosis not present

## 2023-12-17 DIAGNOSIS — N390 Urinary tract infection, site not specified: Secondary | ICD-10-CM | POA: Diagnosis not present

## 2023-12-17 DIAGNOSIS — R1084 Generalized abdominal pain: Secondary | ICD-10-CM | POA: Diagnosis not present

## 2023-12-17 DIAGNOSIS — R109 Unspecified abdominal pain: Secondary | ICD-10-CM | POA: Diagnosis not present

## 2023-12-17 DIAGNOSIS — R82998 Other abnormal findings in urine: Secondary | ICD-10-CM | POA: Diagnosis not present

## 2023-12-20 DIAGNOSIS — J439 Emphysema, unspecified: Secondary | ICD-10-CM | POA: Diagnosis not present

## 2023-12-20 DIAGNOSIS — R942 Abnormal results of pulmonary function studies: Secondary | ICD-10-CM | POA: Diagnosis not present

## 2023-12-20 DIAGNOSIS — R0602 Shortness of breath: Secondary | ICD-10-CM | POA: Diagnosis not present

## 2023-12-20 DIAGNOSIS — Z87891 Personal history of nicotine dependence: Secondary | ICD-10-CM | POA: Diagnosis not present

## 2023-12-20 DIAGNOSIS — I4711 Inappropriate sinus tachycardia, so stated: Secondary | ICD-10-CM | POA: Diagnosis not present

## 2023-12-23 DIAGNOSIS — R682 Dry mouth, unspecified: Secondary | ICD-10-CM | POA: Diagnosis not present

## 2023-12-23 DIAGNOSIS — Z79899 Other long term (current) drug therapy: Secondary | ICD-10-CM | POA: Diagnosis not present

## 2023-12-23 DIAGNOSIS — B9689 Other specified bacterial agents as the cause of diseases classified elsewhere: Secondary | ICD-10-CM | POA: Diagnosis not present

## 2023-12-23 DIAGNOSIS — K130 Diseases of lips: Secondary | ICD-10-CM | POA: Diagnosis not present

## 2023-12-27 DIAGNOSIS — M545 Low back pain, unspecified: Secondary | ICD-10-CM | POA: Diagnosis not present

## 2023-12-27 DIAGNOSIS — E7800 Pure hypercholesterolemia, unspecified: Secondary | ICD-10-CM | POA: Diagnosis not present

## 2023-12-27 DIAGNOSIS — R252 Cramp and spasm: Secondary | ICD-10-CM | POA: Diagnosis not present

## 2023-12-27 DIAGNOSIS — Z6821 Body mass index (BMI) 21.0-21.9, adult: Secondary | ICD-10-CM | POA: Diagnosis not present

## 2023-12-27 DIAGNOSIS — K219 Gastro-esophageal reflux disease without esophagitis: Secondary | ICD-10-CM | POA: Diagnosis not present

## 2023-12-27 DIAGNOSIS — N189 Chronic kidney disease, unspecified: Secondary | ICD-10-CM | POA: Diagnosis not present

## 2024-01-03 DIAGNOSIS — Z79899 Other long term (current) drug therapy: Secondary | ICD-10-CM | POA: Diagnosis not present

## 2024-01-04 DIAGNOSIS — R002 Palpitations: Secondary | ICD-10-CM | POA: Diagnosis not present

## 2024-01-11 ENCOUNTER — Ambulatory Visit: Admitting: Student

## 2024-01-14 ENCOUNTER — Other Ambulatory Visit: Payer: Self-pay | Admitting: Internal Medicine

## 2024-01-31 DIAGNOSIS — F17210 Nicotine dependence, cigarettes, uncomplicated: Secondary | ICD-10-CM | POA: Diagnosis not present

## 2024-01-31 DIAGNOSIS — Z881 Allergy status to other antibiotic agents status: Secondary | ICD-10-CM | POA: Diagnosis not present

## 2024-01-31 DIAGNOSIS — N390 Urinary tract infection, site not specified: Secondary | ICD-10-CM | POA: Diagnosis not present

## 2024-01-31 DIAGNOSIS — Z885 Allergy status to narcotic agent status: Secondary | ICD-10-CM | POA: Diagnosis not present

## 2024-01-31 DIAGNOSIS — Z882 Allergy status to sulfonamides status: Secondary | ICD-10-CM | POA: Diagnosis not present

## 2024-01-31 DIAGNOSIS — I959 Hypotension, unspecified: Secondary | ICD-10-CM | POA: Diagnosis not present

## 2024-01-31 DIAGNOSIS — J45909 Unspecified asthma, uncomplicated: Secondary | ICD-10-CM | POA: Diagnosis not present

## 2024-01-31 DIAGNOSIS — Z136 Encounter for screening for cardiovascular disorders: Secondary | ICD-10-CM | POA: Diagnosis not present

## 2024-01-31 DIAGNOSIS — Z87440 Personal history of urinary (tract) infections: Secondary | ICD-10-CM | POA: Diagnosis not present

## 2024-01-31 DIAGNOSIS — Z7951 Long term (current) use of inhaled steroids: Secondary | ICD-10-CM | POA: Diagnosis not present

## 2024-01-31 DIAGNOSIS — R531 Weakness: Secondary | ICD-10-CM | POA: Diagnosis not present

## 2024-02-29 DIAGNOSIS — Z131 Encounter for screening for diabetes mellitus: Secondary | ICD-10-CM | POA: Diagnosis not present

## 2024-02-29 DIAGNOSIS — Z6821 Body mass index (BMI) 21.0-21.9, adult: Secondary | ICD-10-CM | POA: Diagnosis not present

## 2024-02-29 DIAGNOSIS — Z79899 Other long term (current) drug therapy: Secondary | ICD-10-CM | POA: Diagnosis not present

## 2024-02-29 DIAGNOSIS — F17210 Nicotine dependence, cigarettes, uncomplicated: Secondary | ICD-10-CM | POA: Diagnosis not present

## 2024-02-29 DIAGNOSIS — E039 Hypothyroidism, unspecified: Secondary | ICD-10-CM | POA: Diagnosis not present

## 2024-02-29 DIAGNOSIS — Z1159 Encounter for screening for other viral diseases: Secondary | ICD-10-CM | POA: Diagnosis not present

## 2024-02-29 DIAGNOSIS — Z0000 Encounter for general adult medical examination without abnormal findings: Secondary | ICD-10-CM | POA: Diagnosis not present

## 2024-02-29 DIAGNOSIS — E7800 Pure hypercholesterolemia, unspecified: Secondary | ICD-10-CM | POA: Diagnosis not present

## 2024-02-29 DIAGNOSIS — Z87891 Personal history of nicotine dependence: Secondary | ICD-10-CM | POA: Diagnosis not present

## 2024-03-10 ENCOUNTER — Other Ambulatory Visit: Payer: Self-pay | Admitting: Internal Medicine

## 2024-03-15 DIAGNOSIS — Z87891 Personal history of nicotine dependence: Secondary | ICD-10-CM | POA: Diagnosis not present

## 2024-03-21 DIAGNOSIS — N289 Disorder of kidney and ureter, unspecified: Secondary | ICD-10-CM | POA: Diagnosis not present

## 2024-03-23 DIAGNOSIS — L03011 Cellulitis of right finger: Secondary | ICD-10-CM | POA: Diagnosis not present

## 2024-03-27 DIAGNOSIS — Z6820 Body mass index (BMI) 20.0-20.9, adult: Secondary | ICD-10-CM | POA: Diagnosis not present

## 2024-03-27 DIAGNOSIS — E7800 Pure hypercholesterolemia, unspecified: Secondary | ICD-10-CM | POA: Diagnosis not present

## 2024-03-27 DIAGNOSIS — R1310 Dysphagia, unspecified: Secondary | ICD-10-CM | POA: Diagnosis not present

## 2024-03-27 DIAGNOSIS — N200 Calculus of kidney: Secondary | ICD-10-CM | POA: Diagnosis not present

## 2024-03-27 DIAGNOSIS — E039 Hypothyroidism, unspecified: Secondary | ICD-10-CM | POA: Diagnosis not present

## 2024-03-27 DIAGNOSIS — F17210 Nicotine dependence, cigarettes, uncomplicated: Secondary | ICD-10-CM | POA: Diagnosis not present

## 2024-03-28 DIAGNOSIS — H53483 Generalized contraction of visual field, bilateral: Secondary | ICD-10-CM | POA: Diagnosis not present

## 2024-04-08 ENCOUNTER — Other Ambulatory Visit: Payer: Self-pay | Admitting: Internal Medicine

## 2024-04-13 DIAGNOSIS — S93602A Unspecified sprain of left foot, initial encounter: Secondary | ICD-10-CM | POA: Diagnosis not present

## 2024-04-13 DIAGNOSIS — R600 Localized edema: Secondary | ICD-10-CM | POA: Diagnosis not present

## 2024-04-13 DIAGNOSIS — M7732 Calcaneal spur, left foot: Secondary | ICD-10-CM | POA: Diagnosis not present

## 2024-04-13 DIAGNOSIS — M25572 Pain in left ankle and joints of left foot: Secondary | ICD-10-CM | POA: Diagnosis not present

## 2024-04-13 DIAGNOSIS — S99922A Unspecified injury of left foot, initial encounter: Secondary | ICD-10-CM | POA: Diagnosis not present

## 2024-04-20 DIAGNOSIS — J439 Emphysema, unspecified: Secondary | ICD-10-CM | POA: Diagnosis not present

## 2024-04-20 DIAGNOSIS — Z79899 Other long term (current) drug therapy: Secondary | ICD-10-CM | POA: Diagnosis not present

## 2024-04-20 DIAGNOSIS — Z6820 Body mass index (BMI) 20.0-20.9, adult: Secondary | ICD-10-CM | POA: Diagnosis not present

## 2024-04-20 DIAGNOSIS — F17210 Nicotine dependence, cigarettes, uncomplicated: Secondary | ICD-10-CM | POA: Diagnosis not present

## 2024-04-20 DIAGNOSIS — N1832 Chronic kidney disease, stage 3b: Secondary | ICD-10-CM | POA: Diagnosis not present

## 2024-04-21 DIAGNOSIS — Z79899 Other long term (current) drug therapy: Secondary | ICD-10-CM | POA: Diagnosis not present

## 2024-04-28 DIAGNOSIS — E559 Vitamin D deficiency, unspecified: Secondary | ICD-10-CM | POA: Diagnosis not present

## 2024-04-28 DIAGNOSIS — Z780 Asymptomatic menopausal state: Secondary | ICD-10-CM | POA: Diagnosis not present

## 2024-04-28 DIAGNOSIS — F17210 Nicotine dependence, cigarettes, uncomplicated: Secondary | ICD-10-CM | POA: Diagnosis not present

## 2024-04-28 DIAGNOSIS — E039 Hypothyroidism, unspecified: Secondary | ICD-10-CM | POA: Diagnosis not present

## 2024-04-28 DIAGNOSIS — Z1211 Encounter for screening for malignant neoplasm of colon: Secondary | ICD-10-CM | POA: Diagnosis not present

## 2024-04-28 DIAGNOSIS — Z9189 Other specified personal risk factors, not elsewhere classified: Secondary | ICD-10-CM | POA: Diagnosis not present

## 2024-04-28 DIAGNOSIS — N189 Chronic kidney disease, unspecified: Secondary | ICD-10-CM | POA: Diagnosis not present

## 2024-04-28 DIAGNOSIS — Z6820 Body mass index (BMI) 20.0-20.9, adult: Secondary | ICD-10-CM | POA: Diagnosis not present

## 2024-04-28 DIAGNOSIS — E7800 Pure hypercholesterolemia, unspecified: Secondary | ICD-10-CM | POA: Diagnosis not present

## 2024-05-10 DIAGNOSIS — N390 Urinary tract infection, site not specified: Secondary | ICD-10-CM | POA: Diagnosis not present

## 2024-05-15 DIAGNOSIS — N200 Calculus of kidney: Secondary | ICD-10-CM | POA: Diagnosis not present

## 2024-05-17 DIAGNOSIS — M25461 Effusion, right knee: Secondary | ICD-10-CM | POA: Diagnosis not present

## 2024-05-17 DIAGNOSIS — M25561 Pain in right knee: Secondary | ICD-10-CM | POA: Diagnosis not present

## 2024-05-22 DIAGNOSIS — Z79899 Other long term (current) drug therapy: Secondary | ICD-10-CM | POA: Diagnosis not present

## 2024-05-22 DIAGNOSIS — E7800 Pure hypercholesterolemia, unspecified: Secondary | ICD-10-CM | POA: Diagnosis not present

## 2024-05-22 DIAGNOSIS — F17210 Nicotine dependence, cigarettes, uncomplicated: Secondary | ICD-10-CM | POA: Diagnosis not present

## 2024-05-22 DIAGNOSIS — M1711 Unilateral primary osteoarthritis, right knee: Secondary | ICD-10-CM | POA: Diagnosis not present

## 2024-05-22 DIAGNOSIS — G43109 Migraine with aura, not intractable, without status migrainosus: Secondary | ICD-10-CM | POA: Diagnosis not present

## 2024-05-22 DIAGNOSIS — Z6820 Body mass index (BMI) 20.0-20.9, adult: Secondary | ICD-10-CM | POA: Diagnosis not present

## 2024-05-22 DIAGNOSIS — E559 Vitamin D deficiency, unspecified: Secondary | ICD-10-CM | POA: Diagnosis not present

## 2024-05-22 DIAGNOSIS — Z780 Asymptomatic menopausal state: Secondary | ICD-10-CM | POA: Diagnosis not present

## 2024-05-22 DIAGNOSIS — E039 Hypothyroidism, unspecified: Secondary | ICD-10-CM | POA: Diagnosis not present

## 2024-05-22 DIAGNOSIS — T7840XS Allergy, unspecified, sequela: Secondary | ICD-10-CM | POA: Diagnosis not present

## 2024-05-23 ENCOUNTER — Other Ambulatory Visit: Payer: Self-pay | Admitting: Internal Medicine

## 2024-05-23 DIAGNOSIS — J069 Acute upper respiratory infection, unspecified: Secondary | ICD-10-CM

## 2024-06-01 DIAGNOSIS — E7800 Pure hypercholesterolemia, unspecified: Secondary | ICD-10-CM | POA: Diagnosis not present

## 2024-06-01 DIAGNOSIS — Z6820 Body mass index (BMI) 20.0-20.9, adult: Secondary | ICD-10-CM | POA: Diagnosis not present

## 2024-06-01 DIAGNOSIS — R300 Dysuria: Secondary | ICD-10-CM | POA: Diagnosis not present

## 2024-06-01 DIAGNOSIS — N390 Urinary tract infection, site not specified: Secondary | ICD-10-CM | POA: Diagnosis not present

## 2024-06-01 DIAGNOSIS — F17210 Nicotine dependence, cigarettes, uncomplicated: Secondary | ICD-10-CM | POA: Diagnosis not present

## 2024-06-02 DIAGNOSIS — Z6820 Body mass index (BMI) 20.0-20.9, adult: Secondary | ICD-10-CM | POA: Diagnosis not present

## 2024-06-02 DIAGNOSIS — R300 Dysuria: Secondary | ICD-10-CM | POA: Diagnosis not present
